# Patient Record
Sex: Female | Born: 1980
Health system: Southern US, Community
[De-identification: ages and names within clinical notes are randomized; demographics above are authoritative.]

## PROBLEM LIST (undated history)

## (undated) DIAGNOSIS — K9041 Non-celiac gluten sensitivity: Secondary | ICD-10-CM

## (undated) DIAGNOSIS — R519 Headache, unspecified: Secondary | ICD-10-CM

## (undated) DIAGNOSIS — R42 Dizziness and giddiness: Secondary | ICD-10-CM

## (undated) DIAGNOSIS — F419 Anxiety disorder, unspecified: Secondary | ICD-10-CM

## (undated) DIAGNOSIS — E079 Disorder of thyroid, unspecified: Secondary | ICD-10-CM

## (undated) DIAGNOSIS — E039 Hypothyroidism, unspecified: Secondary | ICD-10-CM

## (undated) DIAGNOSIS — F32A Depression, unspecified: Secondary | ICD-10-CM

## (undated) DIAGNOSIS — F329 Major depressive disorder, single episode, unspecified: Secondary | ICD-10-CM

## (undated) DIAGNOSIS — R51 Headache: Secondary | ICD-10-CM

## (undated) HISTORY — DX: Major depressive disorder, single episode, unspecified: F32.9

## (undated) HISTORY — PX: CHOLECYSTECTOMY: SHX55

## (undated) HISTORY — DX: Anxiety disorder, unspecified: F41.9

## (undated) HISTORY — DX: Depression, unspecified: F32.A

---

## 2017-10-04 DIAGNOSIS — M25561 Pain in right knee: Secondary | ICD-10-CM | POA: Diagnosis not present

## 2017-10-04 DIAGNOSIS — F419 Anxiety disorder, unspecified: Secondary | ICD-10-CM | POA: Diagnosis not present

## 2017-10-04 DIAGNOSIS — J4520 Mild intermittent asthma, uncomplicated: Secondary | ICD-10-CM | POA: Diagnosis not present

## 2017-10-04 DIAGNOSIS — E039 Hypothyroidism, unspecified: Secondary | ICD-10-CM | POA: Diagnosis not present

## 2017-10-04 DIAGNOSIS — M25562 Pain in left knee: Secondary | ICD-10-CM | POA: Diagnosis not present

## 2017-11-14 DIAGNOSIS — Z01411 Encounter for gynecological examination (general) (routine) with abnormal findings: Secondary | ICD-10-CM | POA: Diagnosis not present

## 2017-11-14 DIAGNOSIS — N941 Unspecified dyspareunia: Secondary | ICD-10-CM | POA: Diagnosis not present

## 2017-11-22 DIAGNOSIS — R002 Palpitations: Secondary | ICD-10-CM | POA: Diagnosis not present

## 2017-11-22 DIAGNOSIS — E039 Hypothyroidism, unspecified: Secondary | ICD-10-CM | POA: Diagnosis not present

## 2017-11-22 DIAGNOSIS — N920 Excessive and frequent menstruation with regular cycle: Secondary | ICD-10-CM | POA: Diagnosis not present

## 2017-11-22 DIAGNOSIS — F419 Anxiety disorder, unspecified: Secondary | ICD-10-CM | POA: Diagnosis not present

## 2018-01-07 DIAGNOSIS — J020 Streptococcal pharyngitis: Secondary | ICD-10-CM | POA: Diagnosis not present

## 2018-01-07 DIAGNOSIS — J029 Acute pharyngitis, unspecified: Secondary | ICD-10-CM | POA: Diagnosis not present

## 2018-01-28 ENCOUNTER — Ambulatory Visit: Payer: BLUE CROSS/BLUE SHIELD | Admitting: Internal Medicine

## 2018-02-18 DIAGNOSIS — H66001 Acute suppurative otitis media without spontaneous rupture of ear drum, right ear: Secondary | ICD-10-CM | POA: Diagnosis not present

## 2018-03-25 DIAGNOSIS — K529 Noninfective gastroenteritis and colitis, unspecified: Secondary | ICD-10-CM | POA: Diagnosis not present

## 2018-03-25 DIAGNOSIS — F419 Anxiety disorder, unspecified: Secondary | ICD-10-CM | POA: Diagnosis not present

## 2018-03-25 DIAGNOSIS — E039 Hypothyroidism, unspecified: Secondary | ICD-10-CM | POA: Diagnosis not present

## 2018-05-20 DIAGNOSIS — R110 Nausea: Secondary | ICD-10-CM | POA: Diagnosis not present

## 2018-05-20 DIAGNOSIS — T43615A Adverse effect of caffeine, initial encounter: Secondary | ICD-10-CM | POA: Diagnosis not present

## 2018-05-20 DIAGNOSIS — R12 Heartburn: Secondary | ICD-10-CM | POA: Diagnosis not present

## 2018-05-20 DIAGNOSIS — R42 Dizziness and giddiness: Secondary | ICD-10-CM | POA: Diagnosis not present

## 2018-05-27 DIAGNOSIS — F419 Anxiety disorder, unspecified: Secondary | ICD-10-CM | POA: Diagnosis not present

## 2018-05-27 DIAGNOSIS — Z8659 Personal history of other mental and behavioral disorders: Secondary | ICD-10-CM | POA: Diagnosis not present

## 2018-05-27 DIAGNOSIS — R0789 Other chest pain: Secondary | ICD-10-CM | POA: Diagnosis not present

## 2018-05-27 DIAGNOSIS — E039 Hypothyroidism, unspecified: Secondary | ICD-10-CM | POA: Diagnosis not present

## 2018-05-27 DIAGNOSIS — E559 Vitamin D deficiency, unspecified: Secondary | ICD-10-CM | POA: Diagnosis not present

## 2018-05-28 DIAGNOSIS — Z0000 Encounter for general adult medical examination without abnormal findings: Secondary | ICD-10-CM | POA: Diagnosis not present

## 2018-06-06 ENCOUNTER — Encounter: Payer: Self-pay | Admitting: Internal Medicine

## 2018-06-06 ENCOUNTER — Ambulatory Visit: Payer: BLUE CROSS/BLUE SHIELD | Admitting: Internal Medicine

## 2018-06-06 VITALS — BP 98/72 | HR 79 | Ht 64.0 in | Wt 151.0 lb

## 2018-06-06 DIAGNOSIS — E038 Other specified hypothyroidism: Secondary | ICD-10-CM | POA: Diagnosis not present

## 2018-06-06 DIAGNOSIS — E042 Nontoxic multinodular goiter: Secondary | ICD-10-CM

## 2018-06-06 DIAGNOSIS — E063 Autoimmune thyroiditis: Secondary | ICD-10-CM | POA: Diagnosis not present

## 2018-06-06 MED ORDER — ARMOUR THYROID 60 MG PO TABS: 30.0000 mg | ORAL_TABLET | Freq: Every day | ORAL | 5 refills | Status: DC

## 2018-06-06 MED ORDER — ARMOUR THYROID 90 MG PO TABS: 45.0000 mg | ORAL_TABLET | Freq: Every day | ORAL | 5 refills | Status: DC

## 2018-06-06 NOTE — Patient Instructions (Addendum)
Please change to Armour 75 daily.  You can alternate 60 and 90 mg every other day.  Take the thyroid hormone every day, with water, at least 30 minutes before breakfast, separated by at least 4 hours from: - acid reflux medications - calcium - iron - multivitamins  Please come back in 1.5 months for labs.  Please come back for a follow-up appointment in 6 months.

## 2018-06-06 NOTE — Progress Notes (Signed)
Patient ID: Kara Patton, female   DOB: 07/02/1981, 37 y.o.   MRN: [id number]    HPI  Kara Patton is a 37 y.o.-year-old female, referred by her PCP, Dr. Constance GoltzSchoenhoff, for management of Hashimoto's hypothyroidism. Moved here from [place] 2018.  Pt. has been dx with hypothyroidism in mid 2000s  >> on Levothyroxine 125 mcg (last dose change 03/2018), prev. 137 mcg for years.  She takes the thyroid hormone: - fasting - with water - occas. Coffee (decaf) - half and half - separated by 1h from b'fast  - no calcium, iron, PPIs  - no multivitamins  + Mg, B12, Sertraline, vit D, Osteo-biflex  I reviewed pt's thyroid tests: 05/27/2018: TSH 0.99, free T4 1.27 (0.61-1.12), free T3 3.22 (2.5-3.9) No results found for: TSH, FREET4, T3FREE  Antithyroid antibodies: No results found for: THGAB No components found for: TPOAB  Pt denies: - weight gain  But has: - + fatigue - + cold intolerance - no depression - + diarrhea/+ constipation - + hair loss - + jt pain  She has anxiety >> more pronounced recently.  Pt denies feeling nodules in neck, hoarseness, odynophagia, SOB with lying down. She recently has dysphagia when she eats >> lost 7 lbs over last 3 weeks.  She had a thyroid U/S while she was living in [place]: positive for thyroiditis and few small nodules.  She has + FH of thyroid disorders in: nephew. No FH of thyroid cancer.  No h/o radiation tx to head or neck. No recent use of iodine supplements.  Pt. also has a history of vit D insuff.  ROS: Constitutional: + See HPI Eyes: No blurry vision, no xerophthalmia ENT: no sore throat, + see HPI, + tinnitus Cardiovascular: no CP/+ SOB/+ palpitations/no leg swelling Respiratory: no cough/+ SOB Gastrointestinal: + N/+ V/+ D/+ C, + heartburn Musculoskeletal: + muscle aches/+ joint aches Skin: + rashes, + itching, + easy bruising, + hair loss Neurological: no tremors/numbness/tingling/dizziness, + HA Psychiatric: No depression/+  anxiety  No past medical history on file.  Social History   Socioeconomic History  . Marital status: Married    Spouse name: Not on file  . Number of children: 2  . Years of education: Not on file  . Highest education level: Not on file  Occupational History  .  Stay-at-home mom  Social Needs  . Financial resource strain: Not on file  . Food insecurity:    Worry: Not on file    Inability: Not on file  . Transportation needs:    Medical: Not on file    Non-medical: Not on file  Tobacco Use  . Smoking status: Never Smoker  . Smokeless tobacco: Never Used  Substance and Sexual Activity  . Alcohol use:  Wine, 1-2 times a month, 1-2 drinks at the time    Frequency: Never  . Drug use: Never   Current Outpatient Medications on File Prior to Visit  Medication Sig Dispense Refill  . cholecalciferol (VITAMIN D) 1000 units tablet Take 1,000 Units by mouth daily.    . Glucosamine-Chondroitin (OSTEO BI-FLEX REGULAR STRENGTH PO) Take by mouth.    Marland Kitchen. LORazepam (ATIVAN) 0.5 MG tablet TAKE 1 TABLET BY MOUTH EVERY 12 HOURS AS NEEDED ANXIETY  1  . magnesium 30 MG tablet Take 30 mg by mouth 2 (two) times daily.    . Omega-3 Fatty Acids (FISH OIL) 1000 MG CAPS Take by mouth.    . sertraline (ZOLOFT) 25 MG tablet 50 mg.   1  No current facility-administered medications on file prior to visit.   Levothyroxine 125 mcg daily  Allergies  Allergen Reactions  . Advil [Ibuprofen] Other (See Comments)    Kidney dysfunction  . Penicillins Hives    And swelling    Family history:  Diabetes in mother, paternal uncles, maternal grandmother  Hypertension in mother, father, grandparents  Hyperlipidemia in mother, father, grandparents  Heart disease in father, grandparents, paternal uncle  Cancer in grandmother, cousin, father, uncle, great-grandmother  PE: BP 98/72 (BP Location: Right Arm, Patient Position: Sitting, Cuff Size: Normal)   Pulse 79   Ht 5\' 4"  (1.626 m)   Wt 151 lb (68.5 kg)    SpO2 98%   BMI 25.92 kg/m  Wt Readings from Last 3 Encounters:  06/06/18 151 lb (68.5 kg)   Constitutional: overweight, in NAD Eyes: PERRLA, EOMI, no exophthalmos ENT: moist mucous membranes, no thyromegaly, but left side of the thyroid more prominent, no cervical lymphadenopathy Cardiovascular: RRR, No MRG Respiratory: CTA B Gastrointestinal: abdomen soft, NT, ND, BS+ Musculoskeletal: no deformities, strength intact in all 4 Skin: moist, warm, no rashes Neurological: no tremor with outstretched hands, DTR normal in all 4  ASSESSMENT: 1. Hypothyroidism  2.  History of thyroid nodules  PLAN:  1. Patient with long-standing hypothyroidism, on levothyroxine therapy. Recent labs reviewed from earlier this month >> normal TSH was slightly high free T4, which I explained is usually related to the timing of her levothyroxine dose and not influencing the prompting a change in levothyroxine dose. - she appears euthyroid, but has multiple complaints, including generalized fatigue despite sleeping 8 to 9 hours a night, and also joint pain, cold intolerance, constipation, alternating with episodes of diarrhea - We discussed about the possibility of using a combination of free T4 and free T3 instead of only free T3 to see if this improves her fatigue,  cold intolerance, and other hypothyroid symptoms.  She agrees with this and I suggested Armour, 60 alternating with 90 mg every other day, the equivalent of her current levothyroxine dose of 125 mcg daily - We discussed about correct intake of levothyroxine, fasting, with water, separated by at least 30 minutes from breakfast, and separated by more than 4 hours from calcium, iron, multivitamins, acid reflux medications (PPIs).  She is taking this correctly. - will check thyroid tests in 1.5 months - Otherwise, I will see her back in 6 months  2.  History of thyroid nodules -She has a history of small thyroid nodules seen on ultrasound many years ago  in [place] -She now describes dysphagia with every meal and she has lost a significant amount of weight because of this in the last 3 weeks.  While her symptoms could be related to anxiety and also to previous history of hiatal hernia, I would want to make sure that her thyroid nodules have not grown.  I do not feel a significant thyromegaly, but her left side of the thyroid is more prominent -We will order a thyroid ultrasound now  Narrative    CLINICAL DATA: Hypothyroidism  EXAM: THYROID ULTRASOUND  TECHNIQUE: Ultrasound examination of the thyroid gland and adjacent soft tissues was performed.  COMPARISON: None.  FINDINGS: Parenchymal Echotexture: Moderately heterogenous  Isthmus: 0.3 cm thickness  Right lobe: 5.6 x 1.2 x 1.7 cm  Left lobe: 5.1 x 1.1 x 1.2 cm  _________________________________________________________  Estimated total number of nodules >/= 1 cm: 0  Number of spongiform nodules >/= 2 cm not described below (TR1):  0  Number of mixed cystic and solid nodules >/= 1.5 cm not described below (TR2): 0  _________________________________________________________  No discrete nodules are seen within the thyroid gland.  IMPRESSION: 1. Mild thyromegaly with heterogenous parenchyma. No discrete nodule.  The above is in keeping with the ACR TI-RADS recommendations - J Am Coll Radiol 2017;14:587-595.   Electronically Signed By: Corlis Leak M.D. On: 06/21/2018 14:23     Carlus Pavlov, MD PhD [facility] Endocr[facility]

## 2018-06-07 DIAGNOSIS — R42 Dizziness and giddiness: Secondary | ICD-10-CM | POA: Diagnosis not present

## 2018-06-07 DIAGNOSIS — R109 Unspecified abdominal pain: Secondary | ICD-10-CM | POA: Diagnosis not present

## 2018-06-07 DIAGNOSIS — R110 Nausea: Secondary | ICD-10-CM | POA: Diagnosis not present

## 2018-06-10 ENCOUNTER — Emergency Department (HOSPITAL_COMMUNITY): Payer: BLUE CROSS/BLUE SHIELD

## 2018-06-10 ENCOUNTER — Other Ambulatory Visit: Payer: Self-pay

## 2018-06-10 ENCOUNTER — Encounter (HOSPITAL_COMMUNITY): Payer: Self-pay | Admitting: Emergency Medicine

## 2018-06-10 ENCOUNTER — Emergency Department (HOSPITAL_COMMUNITY)
Admission: EM | Admit: 2018-06-10 | Discharge: 2018-06-10 | Disposition: A | Discharge status: Home / Self Care | Payer: BLUE CROSS/BLUE SHIELD | Attending: Emergency Medicine | Admitting: Emergency Medicine

## 2018-06-10 DIAGNOSIS — E869 Volume depletion, unspecified: Secondary | ICD-10-CM | POA: Diagnosis not present

## 2018-06-10 DIAGNOSIS — R197 Diarrhea, unspecified: Secondary | ICD-10-CM | POA: Insufficient documentation

## 2018-06-10 DIAGNOSIS — E038 Other specified hypothyroidism: Secondary | ICD-10-CM | POA: Diagnosis not present

## 2018-06-10 DIAGNOSIS — R1031 Right lower quadrant pain: Secondary | ICD-10-CM | POA: Diagnosis not present

## 2018-06-10 DIAGNOSIS — Z79899 Other long term (current) drug therapy: Secondary | ICD-10-CM | POA: Insufficient documentation

## 2018-06-10 DIAGNOSIS — R1011 Right upper quadrant pain: Secondary | ICD-10-CM | POA: Diagnosis not present

## 2018-06-10 DIAGNOSIS — R42 Dizziness and giddiness: Secondary | ICD-10-CM | POA: Insufficient documentation

## 2018-06-10 DIAGNOSIS — R112 Nausea with vomiting, unspecified: Secondary | ICD-10-CM

## 2018-06-10 DIAGNOSIS — K529 Noninfective gastroenteritis and colitis, unspecified: Secondary | ICD-10-CM | POA: Diagnosis not present

## 2018-06-10 DIAGNOSIS — R1013 Epigastric pain: Secondary | ICD-10-CM | POA: Diagnosis not present

## 2018-06-10 DIAGNOSIS — R109 Unspecified abdominal pain: Secondary | ICD-10-CM | POA: Diagnosis not present

## 2018-06-10 HISTORY — DX: Non-celiac gluten sensitivity: K90.41

## 2018-06-10 HISTORY — DX: Disorder of thyroid, unspecified: E07.9

## 2018-06-10 LAB — COMPREHENSIVE METABOLIC PANEL
ALBUMIN: 4.6 g/dL (ref 3.5–5.0)
ALK PHOS: 39 U/L (ref 38–126)
ALT: 14 U/L (ref 0–44)
AST: 15 U/L (ref 15–41)
Anion gap: 11 (ref 5–15)
BUN: 9 mg/dL (ref 6–20)
CHLORIDE: 104 mmol/L (ref 98–111)
CO2: 27 mmol/L (ref 22–32)
Calcium: 9.7 mg/dL (ref 8.9–10.3)
Creatinine, Ser: 0.75 mg/dL (ref 0.44–1.00)
GFR calc non Af Amer: >60 mL/min (ref >60)
Glucose, Bld: 89 mg/dL (ref 70–99)
POTASSIUM: 4.2 mmol/L (ref 3.5–5.1)
SODIUM: 142 mmol/L (ref 135–145)
TOTAL PROTEIN: 7.9 g/dL (ref 6.5–8.1)
Total Bilirubin: 1.1 mg/dL (ref 0.3–1.2)

## 2018-06-10 LAB — CBC
HEMATOCRIT: 48.3 % — AB (ref 36.0–46.0)
HEMOGLOBIN: 15.7 g/dL — AB (ref 12.0–15.0)
MCH: 30.1 pg (ref 26.0–34.0)
MCHC: 32.5 g/dL (ref 30.0–36.0)
MCV: 92.5 fL (ref 78.0–100.0)
Platelets: 217 10*3/uL (ref 150–400)
RBC: 5.22 MIL/uL — AB (ref 3.87–5.11)
RDW: 11.4 % — ABNORMAL LOW (ref 11.5–15.5)
WBC: 6 10*3/uL (ref 4.0–10.5)

## 2018-06-10 LAB — URINALYSIS, ROUTINE W REFLEX MICROSCOPIC
BACTERIA UA: NONE SEEN
BILIRUBIN URINE: NEGATIVE
Glucose, UA: NEGATIVE mg/dL
KETONES UR: 20 mg/dL — AB
LEUKOCYTES UA: NEGATIVE
NITRITE: NEGATIVE
Protein, ur: NEGATIVE mg/dL
Specific Gravity, Urine: 1.013 (ref 1.005–1.030)
pH: 6 (ref 5.0–8.0)

## 2018-06-10 LAB — WET PREP, GENITAL
Clue Cells Wet Prep HPF POC: NONE SEEN
Sperm: NONE SEEN
Trich, Wet Prep: NONE SEEN
Yeast Wet Prep HPF POC: NONE SEEN

## 2018-06-10 LAB — I-STAT BETA HCG BLOOD, ED (MC, WL, AP ONLY)

## 2018-06-10 LAB — LIPASE, BLOOD: Lipase: 27 U/L (ref 11–51)

## 2018-06-10 MED ORDER — FAMOTIDINE IN NACL 20-0.9 MG/50ML-% IV SOLN
20.0000 mg | Freq: Once | INTRAVENOUS | Status: AC
  Administered 2018-06-10: 20 mg via INTRAVENOUS
  Filled 2018-06-10: qty 50

## 2018-06-10 MED ORDER — SODIUM CHLORIDE 0.9 % IV BOLUS
1000.0000 mL | Freq: Once | INTRAVENOUS | Status: AC
  Administered 2018-06-10: 1000 mL via INTRAVENOUS

## 2018-06-10 MED ORDER — IOPAMIDOL (ISOVUE-300) INJECTION 61%
100.0000 mL | Freq: Once | INTRAVENOUS | Status: AC | PRN
  Administered 2018-06-10: 100 mL via INTRAVENOUS

## 2018-06-10 MED ORDER — ONDANSETRON HCL 4 MG/2ML IJ SOLN
4.0000 mg | Freq: Once | INTRAMUSCULAR | Status: AC
  Administered 2018-06-10: 4 mg via INTRAVENOUS
  Filled 2018-06-10: qty 2

## 2018-06-10 MED ORDER — GI COCKTAIL ~~LOC~~
30.0000 mL | Freq: Once | ORAL | Status: DC
  Filled 2018-06-10: qty 30

## 2018-06-10 MED ORDER — IOPAMIDOL (ISOVUE-300) INJECTION 61%
INTRAVENOUS | Status: AC
  Filled 2018-06-10: qty 100

## 2018-06-10 NOTE — ED Notes (Signed)
Pt given water and applesauce  

## 2018-06-10 NOTE — Discharge Instructions (Signed)
1. Medications: Take Zofran as needed for nausea.  Wait around 20 minutes before eating or drinking after taking this medication.Take nexium as prescribed.  2. Treatment: rest, drink plenty of fluids, advance diet slowly.  Start with water and broth then advance to bland foods that will not upset your stomach such as crackers, mashed potatoes, and peanut butter.  You may also find it helpful to start taking a probiotic. 3. Follow Up: Please followup with your primary doctor in 3 days for discussion of your diagnoses and further evaluation after today's visit; follow-up with a gastroenterologist.  Please return to the ER for persistent vomiting, high fevers or worsening symptoms

## 2018-06-10 NOTE — ED Provider Notes (Signed)
Patient placed in Quick Look pathway, seen and evaluated   Chief Comlaint: Abdominal pain, nausea, dizziness  HPI:   37 year old female who presents for evaluation of 4 days of progressive worsening dizziness, nausea, right lower quadrant pain.  She was seen by Dr. 3 days ago for evaluation of symptoms.  He was given Zofran.  Patient reports she comes in today because she is continued having symptoms and abdominal pain has worsened.  She reports she has had some episodes of vomiting.  No blood noted in the vomit.  She reports decreased appetite and GERD symptoms.  She states she was having diarrhea.  No blood in the stool.  Diarrhea has improved significantly.  Patient reports that her dental pain is in the right side of her abdomen.  Denies any chest pain, difficulty breathing, urinary complaints.  ROS: Abdominal pain, nausea, dizziness  Physical Exam:   Gen: No distress  Neuro: Awake and Alert  Skin: Warm    Focused Exam: Abdomen soft, nondistended.  Tenderness to palpation in the right upper quadrant and right lower quadrant. CN II-XII intact. 5/5 strength of BUE and BLE.   Initiation of care has begun. The patient has been counseled on the process, plan, and necessity for staying for the completion/evaluation, and the remainder of the medical screening examination    Maxwell CaulLayden, Lindsey A, PA-C 06/10/18 1602    Rolan BuccoBelfi, Melanie, MD 06/10/18 2239

## 2018-06-10 NOTE — ED Notes (Signed)
Patient transported to CT 

## 2018-06-10 NOTE — ED Notes (Signed)
ED Provider at bedside. 

## 2018-06-10 NOTE — ED Provider Notes (Signed)
[facility] Provider Note   CSN: [id number] Arrival date & time: 06/10/18  1510     History   Chief Complaint Chief Complaint  Patient presents with  . Abdominal Pain    HPI Kara Patton is a 37 y.o. female with history of gluten intolerance and hypothyroidism presents for evaluation of acute onset, progressively worsening right-sided abdominal pain for 4 days.  Associated symptoms include lightheadedness with position changes, nausea, and 2 episodes of nonbloody nonbilious emesis.  She also has had 3-4 episodes of watery nonbloody loose stools daily for the past 4 days.  Pain in the abdomen is constant, burning radiating from just under the rib cage down to the right lower quadrant.  Pain worsens after eating.  She denies dysuria, hematuria, melena, hematochezia, urinary urgency, or frequency.  She does note decreased urine output but has had decreased oral intake.  She was seen and evaluated by her PCP 3 days ago which was overall unremarkable.  She was also tested for H. pylori which was negative.  She was seen and evaluated by another PCP in the heart practice earlier today who recommended presentation to the ED for further evaluation.  She states that they obtained orthostatic vital signs and that her blood pressure was low.  She states that the lightheadedness occurs primarily with position changes and persists with standing and that she is only comfortable when she is laying.  Denies vaginal itching, bleeding, or discharge out of the ordinary but is currently on her menstrual cycle.  The history is provided by the patient.    Past Medical History:  Diagnosis Date  . Gluten intolerance   . Thyroid disease    hypothyroid    Patient Active Problem List   Diagnosis Date Noted  . Hypothyroidism due to Hashimoto's thyroiditis 06/06/2018  . Multiple thyroid nodules 06/06/2018   OB History   None      Home Medications    Prior to Admission  medications   Medication Sig Start Date End Date Taking? Authorizing Provider  ARMOUR THYROID 60 MG tablet Take 0.5 tablets (30 mg total) by mouth daily before breakfast. 06/06/18   Carlus PavlovGherghe, Cristina, MD  ARMOUR THYROID 90 MG tablet Take 0.5 tablets (45 mg total) by mouth daily. 06/06/18   Carlus PavlovGherghe, Cristina, MD  cholecalciferol (VITAMIN D) 1000 units tablet Take 1,000 Units by mouth daily.    [provider]  Glucosamine-Chondroitin (OSTEO BI-FLEX REGULAR STRENGTH PO) Take by mouth.    [provider]  LORazepam (ATIVAN) 0.5 MG tablet TAKE 1 TABLET BY MOUTH EVERY 12 HOURS AS NEEDED ANXIETY 05/27/18   [provider]  magnesium 30 MG tablet Take 30 mg by mouth 2 (two) times daily.    [provider]  Omega-3 Fatty Acids (FISH OIL) 1000 MG CAPS Take by mouth.    [provider]  sertraline (ZOLOFT) 25 MG tablet 50 mg.  05/27/18   [provider]    Family History No family history on file.  Social History Social History   Tobacco Use  . Smoking status: Never Smoker  . Smokeless tobacco: Never Used  Substance Use Topics  . Alcohol use: Never    Frequency: Never  . Drug use: Never     Allergies   Advil [ibuprofen] and Penicillins   Review of Systems Review of Systems  Constitutional: Negative for chills and fever.  Eyes: Negative for photophobia and visual disturbance.  Respiratory: Negative for shortness of breath.  Cardiovascular: Negative for chest pain.  Gastrointestinal: Positive for abdominal pain, diarrhea, nausea and vomiting.  Genitourinary: Negative for dysuria, frequency, hematuria, urgency, vaginal discharge and vaginal pain.  Neurological: Positive for light-headedness. Negative for syncope, weakness and numbness.  All other systems reviewed and are negative.    Physical Exam Updated Vital Signs BP 105/83   Pulse (!) 57   Temp 98.4 F (36.9 C) (Oral)   Resp 18   LMP 06/07/2018   SpO2 99%   Physical Exam   Constitutional: She appears well-developed and well-nourished. No distress.  HENT:  Head: Normocephalic and atraumatic.  Eyes: Conjunctivae are normal. Right eye exhibits no discharge. Left eye exhibits no discharge.  Neck: No JVD present. No tracheal deviation present.  Cardiovascular: Normal rate, regular rhythm and normal heart sounds.  Pulmonary/Chest: Effort normal and breath sounds normal.  Abdominal: Soft. She exhibits no distension. Bowel sounds are decreased. There is tenderness in the right upper quadrant, right lower quadrant, epigastric area, periumbilical area and suprapubic area. There is guarding, CVA tenderness and tenderness at McBurney's point. There is no rigidity, no rebound and negative Murphy's sign.  Right CVA tenderness present  Genitourinary: Uterus is not deviated, not enlarged and not tender. Cervix exhibits no motion tenderness and no friability. Right adnexum displays no mass, no tenderness and no fullness. Left adnexum displays no mass, no tenderness and no fullness. There is bleeding in the vagina.  Genitourinary Comments: Examination performed in the presence of chaperone.  No masses or lesions to the external genitalia.  Blood in the vaginal vault noted but patient is currently menstruating  Musculoskeletal: She exhibits no edema.  Neurological: She is alert.  Skin: Skin is warm and dry. No erythema.  Psychiatric: She has a normal mood and affect. Her behavior is normal.  Nursing note and vitals reviewed.    ED Treatments / Results  Labs (all labs ordered are listed, but only abnormal results are displayed) Labs Reviewed  WET PREP, GENITAL - Abnormal; Notable for the following components:      Result Value   WBC, Wet Prep HPF POC MANY (*)    All other components within normal limits  CBC - Abnormal; Notable for the following components:   RBC 5.22 (*)    Hemoglobin 15.7 (*)    HCT 48.3 (*)    RDW 11.4 (*)    All other components within normal limits    URINALYSIS, ROUTINE W REFLEX MICROSCOPIC - Abnormal; Notable for the following components:   Hgb urine dipstick LARGE (*)    Ketones, ur 20 (*)    RBC / HPF >50 (*)    All other components within normal limits  LIPASE, BLOOD  COMPREHENSIVE METABOLIC PANEL  I-STAT BETA HCG BLOOD, ED ([facility], [facility], [facility] ONLY)  GC/CHLAMYDIA PROBE AMP ([place]) NOT AT [facility]    EKG None  Radiology Ct Abdomen Pelvis W Contrast  Result Date: 06/10/2018 CLINICAL DATA:  Right lower quadrant pain, nausea EXAM: CT ABDOMEN AND PELVIS WITH CONTRAST TECHNIQUE: Multidetector CT imaging of the abdomen and pelvis was performed using the standard protocol following bolus administration of intravenous contrast. CONTRAST:  ISOVUE-300 IOPAMIDOL (ISOVUE-300) INJECTION 61% COMPARISON:  None. FINDINGS: Lower chest: Lung bases are clear. No effusions. Heart is normal size. Hepatobiliary: No focal liver abnormality is seen. Status post cholecystectomy. No biliary dilatation. Pancreas: No focal abnormality or ductal dilatation. Spleen: No focal abnormality.  Normal size. Adrenals/Urinary Tract: No adrenal abnormality. No focal renal abnormality. No stones or  hydronephrosis. Urinary bladder is unremarkable. Stomach/Bowel: Normal appendix. Stomach, large and small bowel grossly unremarkable. Vascular/Lymphatic: No evidence of aneurysm or adenopathy. Reproductive: Uterus and adnexa unremarkable.  No mass. Other: No free fluid or free air. Musculoskeletal: No acute bony abnormality or focal bone lesion. IMPRESSION: Prior cholecystectomy. Normal appendix. No acute findings in the abdomen or pelvis. Electronically Signed   By: Charlett Nose M.D.   On: 06/10/2018 19:47    Procedures Procedures (including critical care time)  Medications Ordered in ED Medications  gi cocktail (Maalox,Lidocaine,Donnatal) (0 mLs Oral Hold 06/10/18 1819)  iopamidol (ISOVUE-300) 61 % injection (has no administration in time range)  sodium chloride 0.9 % bolus  1,000 mL (0 mLs Intravenous Stopped 06/10/18 2021)  famotidine (PEPCID) IVPB 20 mg premix (0 mg Intravenous Stopped 06/10/18 1911)  ondansetron (ZOFRAN) injection 4 mg (4 mg Intravenous Given 06/10/18 1942)  iopamidol (ISOVUE-300) 61 % injection 100 mL (100 mLs Intravenous Contrast Given 06/10/18 1920)  sodium chloride 0.9 % bolus 1,000 mL (0 mLs Intravenous Stopped 06/10/18 2121)     Initial Impression / Assessment and Plan / ED Course  I have reviewed the triage vital signs and the nursing notes.  Pertinent labs & imaging results that were available during my care of the patient were reviewed by me and considered in my medical decision making (see chart for details).    Patient with complaint of 4-day history of right-sided abdominal pain, nausea, vomiting, diarrhea, and positional lightheadedness.  She is afebrile, vital signs are stable.  She is nontoxic in appearance.  She is not orthostatic but she does become lightheaded when standing.  No syncope.  No focal neurologic deficits.  No peritoneal signs on examination of the abdomen.  Lab work reviewed by me shows no leukocytosis, no anemia, no electrolyte abnormalities.  LFTs, lipase, and creatinine are within normal limits.  She does have blood in her urine although she is currently menstruating so I believe this is contaminated.  Otherwise no evidence of UTI or nephrolithiasis.  The patient does appear to be dehydrated with ketones in her urine.  No cervical motion tenderness or adnexal tenderness to suggest PID.  I doubt obstruction, perforation, but as it is, colitis, ectopic pregnancy, ovarian torsion, TOA, or other acute surgical abdominal pathology.  CT of the abdomen and pelvis shows no acute findings.  On reevaluation, the patient is resting comfortably no apparent distress.  She received IV fluids, IV Pepcid, and GI cocktail and states that she is feeling better.  She is tolerating p.o. food and fluids without difficulty, serial abdominal  examinations remain benign and she is ambulatory without difficulty.  Stable for discharge home with follow-up with her PCP.  We will also refer her to gastroenterology.  Discussed strict ED return precautions.  She has Zofran at home which has been helpful for her.  She is also going to start Nexium which her PCP is prescribing for her.  Instructions to advance diet slowly and add probiotics.  Patient and patient's mother verbalized understanding of and agreement with plan and patient is stable for discharge home at this time.  Final Clinical Impressions(s) / ED Diagnoses   Final diagnoses:  Right sided abdominal pain  Nausea vomiting and diarrhea  Intermittent lightheadedness    ED Discharge Orders    None       Bennye Alm 06/10/18 2203    Little, Ambrose Finland, MD 06/12/18 [phone]

## 2018-06-10 NOTE — ED Triage Notes (Signed)
Pt sent by PCP with c/o RLQ pain, nausea, dizziness x 4 days. Dizziness improves with lying flat.

## 2018-06-11 LAB — GC/CHLAMYDIA PROBE AMP (~~LOC~~) NOT AT ARMC
Chlamydia: NEGATIVE
Neisseria Gonorrhea: NEGATIVE

## 2018-06-12 DIAGNOSIS — R1310 Dysphagia, unspecified: Secondary | ICD-10-CM | POA: Diagnosis not present

## 2018-06-12 DIAGNOSIS — R1013 Epigastric pain: Secondary | ICD-10-CM | POA: Diagnosis not present

## 2018-06-13 DIAGNOSIS — R12 Heartburn: Secondary | ICD-10-CM | POA: Diagnosis not present

## 2018-06-13 DIAGNOSIS — K2940 Chronic atrophic gastritis without bleeding: Secondary | ICD-10-CM | POA: Diagnosis not present

## 2018-06-13 DIAGNOSIS — R1013 Epigastric pain: Secondary | ICD-10-CM | POA: Diagnosis not present

## 2018-06-13 DIAGNOSIS — R197 Diarrhea, unspecified: Secondary | ICD-10-CM | POA: Diagnosis not present

## 2018-06-13 DIAGNOSIS — R1314 Dysphagia, pharyngoesophageal phase: Secondary | ICD-10-CM | POA: Diagnosis not present

## 2018-06-13 DIAGNOSIS — R1310 Dysphagia, unspecified: Secondary | ICD-10-CM | POA: Diagnosis not present

## 2018-06-13 DIAGNOSIS — R195 Other fecal abnormalities: Secondary | ICD-10-CM | POA: Diagnosis not present

## 2018-06-16 DIAGNOSIS — R42 Dizziness and giddiness: Secondary | ICD-10-CM

## 2018-06-16 HISTORY — DX: Dizziness and giddiness: R42

## 2018-06-18 ENCOUNTER — Emergency Department (HOSPITAL_COMMUNITY): Payer: BLUE CROSS/BLUE SHIELD

## 2018-06-18 ENCOUNTER — Other Ambulatory Visit: Payer: Self-pay | Admitting: Geriatric Medicine

## 2018-06-18 ENCOUNTER — Encounter (HOSPITAL_COMMUNITY): Payer: Self-pay

## 2018-06-18 ENCOUNTER — Other Ambulatory Visit: Payer: Self-pay

## 2018-06-18 ENCOUNTER — Telehealth: Payer: Self-pay | Admitting: Internal Medicine

## 2018-06-18 ENCOUNTER — Observation Stay (HOSPITAL_COMMUNITY)
Admission: EM | Admit: 2018-06-18 | Discharge: 2018-06-20 | Disposition: A | Discharge status: Home / Self Care | Payer: BLUE CROSS/BLUE SHIELD | Attending: Internal Medicine | Admitting: Internal Medicine

## 2018-06-18 DIAGNOSIS — R55 Syncope and collapse: Secondary | ICD-10-CM | POA: Diagnosis not present

## 2018-06-18 DIAGNOSIS — R42 Dizziness and giddiness: Secondary | ICD-10-CM

## 2018-06-18 DIAGNOSIS — R531 Weakness: Secondary | ICD-10-CM | POA: Insufficient documentation

## 2018-06-18 DIAGNOSIS — G4452 New daily persistent headache (NDPH): Secondary | ICD-10-CM

## 2018-06-18 DIAGNOSIS — R0602 Shortness of breath: Secondary | ICD-10-CM | POA: Diagnosis not present

## 2018-06-18 DIAGNOSIS — Z79899 Other long term (current) drug therapy: Secondary | ICD-10-CM | POA: Insufficient documentation

## 2018-06-18 DIAGNOSIS — R110 Nausea: Secondary | ICD-10-CM | POA: Diagnosis not present

## 2018-06-18 DIAGNOSIS — F410 Panic disorder [episodic paroxysmal anxiety] without agoraphobia: Secondary | ICD-10-CM | POA: Diagnosis not present

## 2018-06-18 DIAGNOSIS — R109 Unspecified abdominal pain: Secondary | ICD-10-CM | POA: Diagnosis not present

## 2018-06-18 DIAGNOSIS — R0789 Other chest pain: Secondary | ICD-10-CM | POA: Diagnosis not present

## 2018-06-18 DIAGNOSIS — R9431 Abnormal electrocardiogram [ECG] [EKG]: Secondary | ICD-10-CM | POA: Diagnosis not present

## 2018-06-18 DIAGNOSIS — R112 Nausea with vomiting, unspecified: Secondary | ICD-10-CM | POA: Diagnosis not present

## 2018-06-18 DIAGNOSIS — R51 Headache: Secondary | ICD-10-CM | POA: Insufficient documentation

## 2018-06-18 DIAGNOSIS — K9041 Non-celiac gluten sensitivity: Secondary | ICD-10-CM | POA: Insufficient documentation

## 2018-06-18 DIAGNOSIS — R079 Chest pain, unspecified: Secondary | ICD-10-CM | POA: Diagnosis not present

## 2018-06-18 DIAGNOSIS — E063 Autoimmune thyroiditis: Secondary | ICD-10-CM | POA: Diagnosis not present

## 2018-06-18 DIAGNOSIS — Z79890 Hormone replacement therapy (postmenopausal): Secondary | ICD-10-CM | POA: Diagnosis not present

## 2018-06-18 DIAGNOSIS — E038 Other specified hypothyroidism: Secondary | ICD-10-CM | POA: Diagnosis present

## 2018-06-18 DIAGNOSIS — Z9049 Acquired absence of other specified parts of digestive tract: Secondary | ICD-10-CM | POA: Diagnosis not present

## 2018-06-18 HISTORY — DX: Headache, unspecified: R51.9

## 2018-06-18 HISTORY — DX: Headache: R51

## 2018-06-18 HISTORY — DX: Dizziness and giddiness: R42

## 2018-06-18 HISTORY — DX: Hypothyroidism, unspecified: E03.9

## 2018-06-18 LAB — CBC WITH DIFFERENTIAL/PLATELET
Abs Immature Granulocytes: 0 10*3/uL (ref 0.0–0.1)
Basophils Absolute: 0 10*3/uL (ref 0.0–0.1)
Basophils Relative: 0 %
EOS ABS: 0 10*3/uL (ref 0.0–0.7)
EOS PCT: 0 %
HEMATOCRIT: 43.5 % (ref 36.0–46.0)
Hemoglobin: 14.6 g/dL (ref 12.0–15.0)
IMMATURE GRANULOCYTES: 0 %
LYMPHS ABS: 2.1 10*3/uL (ref 0.7–4.0)
Lymphocytes Relative: 32 %
MCH: 30.4 pg (ref 26.0–34.0)
MCHC: 33.6 g/dL (ref 30.0–36.0)
MCV: 90.6 fL (ref 78.0–100.0)
MONO ABS: 0.5 10*3/uL (ref 0.1–1.0)
MONOS PCT: 8 %
NEUTROS PCT: 60 %
Neutro Abs: 3.9 10*3/uL (ref 1.7–7.7)
Platelets: 210 10*3/uL (ref 150–400)
RBC: 4.8 MIL/uL (ref 3.87–5.11)
RDW: 11.1 % — AB (ref 11.5–15.5)
WBC: 6.5 10*3/uL (ref 4.0–10.5)

## 2018-06-18 LAB — I-STAT TROPONIN, ED
TROPONIN I, POC: 0.02 ng/mL (ref 0.00–0.08)
Troponin i, poc: 0.02 ng/mL (ref 0.00–0.08)

## 2018-06-18 LAB — COMPREHENSIVE METABOLIC PANEL
ALT: 14 U/L (ref 0–44)
AST: 12 U/L — AB (ref 15–41)
Albumin: 4.2 g/dL (ref 3.5–5.0)
Alkaline Phosphatase: 35 U/L — ABNORMAL LOW (ref 38–126)
Anion gap: 9 (ref 5–15)
BILIRUBIN TOTAL: 0.9 mg/dL (ref 0.3–1.2)
BUN: 11 mg/dL (ref 6–20)
CALCIUM: 9.6 mg/dL (ref 8.9–10.3)
CHLORIDE: 105 mmol/L (ref 98–111)
CO2: 29 mmol/L (ref 22–32)
CREATININE: 0.69 mg/dL (ref 0.44–1.00)
Glucose, Bld: 100 mg/dL — ABNORMAL HIGH (ref 70–99)
Potassium: 4.1 mmol/L (ref 3.5–5.1)
Sodium: 143 mmol/L (ref 135–145)
Total Protein: 7.4 g/dL (ref 6.5–8.1)

## 2018-06-18 LAB — LIPASE, BLOOD: LIPASE: 33 U/L (ref 11–51)

## 2018-06-18 LAB — URINALYSIS, ROUTINE W REFLEX MICROSCOPIC
Bilirubin Urine: NEGATIVE
GLUCOSE, UA: NEGATIVE mg/dL
HGB URINE DIPSTICK: NEGATIVE
KETONES UR: 5 mg/dL — AB
Leukocytes, UA: NEGATIVE
Nitrite: NEGATIVE
PROTEIN: NEGATIVE mg/dL
SPECIFIC GRAVITY, URINE: 1.006 (ref 1.005–1.030)
pH: 6 (ref 5.0–8.0)

## 2018-06-18 LAB — I-STAT BETA HCG BLOOD, ED (MC, WL, AP ONLY): I-stat hCG, quantitative: <5 m[IU]/mL (ref <5)

## 2018-06-18 MED ORDER — ONDANSETRON HCL 4 MG/2ML IJ SOLN
4.0000 mg | Freq: Once | INTRAMUSCULAR | Status: AC
  Administered 2018-06-18: 4 mg via INTRAVENOUS
  Filled 2018-06-18: qty 2

## 2018-06-18 MED ORDER — IOPAMIDOL (ISOVUE-370) INJECTION 76%
INTRAVENOUS | Status: AC
  Filled 2018-06-18: qty 100

## 2018-06-18 MED ORDER — SODIUM CHLORIDE 0.9 % IV BOLUS
2000.0000 mL | Freq: Once | INTRAVENOUS | Status: AC
  Administered 2018-06-18: 2000 mL via INTRAVENOUS

## 2018-06-18 MED ORDER — ACETAMINOPHEN 325 MG PO TABS
650.0000 mg | ORAL_TABLET | Freq: Once | ORAL | Status: AC
  Administered 2018-06-18: 650 mg via ORAL
  Filled 2018-06-18: qty 2

## 2018-06-18 MED ORDER — IOPAMIDOL (ISOVUE-370) INJECTION 76%
100.0000 mL | Freq: Once | INTRAVENOUS | Status: AC | PRN
  Administered 2018-06-18: 100 mL via INTRAVENOUS

## 2018-06-18 NOTE — Telephone Encounter (Signed)
Pt came into office after leaving PCP and we sent to ER she is weak, head pain, and just feeling awful. Thyroid labs from last visit where all normal.

## 2018-06-18 NOTE — Telephone Encounter (Signed)
LMTCB

## 2018-06-18 NOTE — Telephone Encounter (Signed)
Sorry to hear that!  No, these should not be side effects from Armour. Anxiety may appear, but it would be uncommon. Just To be safe. I may suggest to go back to Levothyroxine 125 mcg daily until she feels better, Then retry Armour.

## 2018-06-18 NOTE — ED Provider Notes (Signed)
Patient placed in Quick Look pathway, seen and evaluated   Chief Complaint: multiple complaints  HPI:   Pt is a 37 y/o female with a h/o gluten intolerance, hypothyroidism, cholecystectomy who presents to the ED today for evaluation of multiple complaints. Pt states that for the last 12 days she has had abdominal pain, nausea, vomiting (resolved), diarrhea (resolved), chest tightness. Pt is also c/o pressure to the back of her head, dizziness, near syncope, and generalized weakness. Has had decreased PO intake and states she has lost 12 lbs since onset of her sxs.  Was seen in the ED and had negative workup recently. She was referred to GI and had an EGD 6 days ago. She was placed on carafate QID and pantoprazole with no improvement. Recently discontinued sertraline 5 days ago.   Has h/o hashimotos and is scheduled for thyroid US in 2 days.   ROS: abd pain, chest tightness (one)  Physical Exam:   Gen: No distress  Neuro: Awake and Alert  Skin: Warm    Focused Exam: CTAB, RRR, no murmur. TTP to RUQ and epigastrium. Hyperactive BS. No rigidity or guarding.   Initiation of care has begun. The patient has been counseled on the process, plan, and necessity for staying for the completion/evaluation, and the remainder of the medical screening examination  Pt advised to inform nursing staff immediately if they experience any new or worsening of symptoms while waiting in the waiting room.    Rayne Du 06/18/18 1533    Raeford Razor, MD 06/19/18 [phone]

## 2018-06-18 NOTE — ED Notes (Signed)
Pt taken to CT.

## 2018-06-18 NOTE — Telephone Encounter (Signed)
Patient's husband stated that the patient is very ill. They are not sure if this could be caused by her thyroid. They requested a call back from the nurse,.   Please advise

## 2018-06-18 NOTE — ED Triage Notes (Signed)
Pt seen here on the 26th of August for abdominal pain and followed up with GI specialists.  Stating still not able to keep much down and now having chest pain along with it.  Still feeling like she is going to pass out when walking, resolves when sitting.  A&Ox4.

## 2018-06-18 NOTE — Telephone Encounter (Signed)
Started to take sertraline before appointment with our office. Pt stated that she has been sick for 2 weeks, seen in hospital told it was for dehydration. Waiting on biopsy results from endoscopy. Swallowing issue has gotten worse. Pt has a burning from her lower abdomen to her throat. Diarrhea, nausea, and trouble walking distants. Her anxiety is very high, and they would like to know if this could be related to anything from an Endocrinology stand point?

## 2018-06-19 ENCOUNTER — Ambulatory Visit (HOSPITAL_BASED_OUTPATIENT_CLINIC_OR_DEPARTMENT_OTHER): Payer: BLUE CROSS/BLUE SHIELD

## 2018-06-19 ENCOUNTER — Encounter (HOSPITAL_COMMUNITY): Payer: Self-pay | Admitting: Physician Assistant

## 2018-06-19 ENCOUNTER — Other Ambulatory Visit (HOSPITAL_COMMUNITY): Payer: BLUE CROSS/BLUE SHIELD

## 2018-06-19 ENCOUNTER — Other Ambulatory Visit: Payer: Self-pay

## 2018-06-19 ENCOUNTER — Observation Stay (HOSPITAL_COMMUNITY): Payer: BLUE CROSS/BLUE SHIELD

## 2018-06-19 DIAGNOSIS — R9431 Abnormal electrocardiogram [ECG] [EKG]: Secondary | ICD-10-CM | POA: Diagnosis not present

## 2018-06-19 DIAGNOSIS — R109 Unspecified abdominal pain: Secondary | ICD-10-CM

## 2018-06-19 DIAGNOSIS — R42 Dizziness and giddiness: Secondary | ICD-10-CM

## 2018-06-19 DIAGNOSIS — R072 Precordial pain: Secondary | ICD-10-CM

## 2018-06-19 DIAGNOSIS — E038 Other specified hypothyroidism: Secondary | ICD-10-CM | POA: Diagnosis not present

## 2018-06-19 DIAGNOSIS — K2940 Chronic atrophic gastritis without bleeding: Secondary | ICD-10-CM | POA: Diagnosis not present

## 2018-06-19 DIAGNOSIS — F410 Panic disorder [episodic paroxysmal anxiety] without agoraphobia: Secondary | ICD-10-CM | POA: Diagnosis not present

## 2018-06-19 DIAGNOSIS — K9041 Non-celiac gluten sensitivity: Secondary | ICD-10-CM | POA: Diagnosis not present

## 2018-06-19 DIAGNOSIS — R0789 Other chest pain: Secondary | ICD-10-CM | POA: Diagnosis not present

## 2018-06-19 DIAGNOSIS — R112 Nausea with vomiting, unspecified: Secondary | ICD-10-CM | POA: Diagnosis present

## 2018-06-19 DIAGNOSIS — Z79890 Hormone replacement therapy (postmenopausal): Secondary | ICD-10-CM | POA: Diagnosis not present

## 2018-06-19 DIAGNOSIS — E063 Autoimmune thyroiditis: Secondary | ICD-10-CM

## 2018-06-19 DIAGNOSIS — R079 Chest pain, unspecified: Secondary | ICD-10-CM | POA: Diagnosis not present

## 2018-06-19 DIAGNOSIS — R531 Weakness: Secondary | ICD-10-CM | POA: Diagnosis not present

## 2018-06-19 DIAGNOSIS — Z79899 Other long term (current) drug therapy: Secondary | ICD-10-CM | POA: Diagnosis not present

## 2018-06-19 DIAGNOSIS — Z9049 Acquired absence of other specified parts of digestive tract: Secondary | ICD-10-CM | POA: Diagnosis not present

## 2018-06-19 DIAGNOSIS — R51 Headache: Secondary | ICD-10-CM | POA: Diagnosis not present

## 2018-06-19 DIAGNOSIS — R55 Syncope and collapse: Secondary | ICD-10-CM | POA: Diagnosis not present

## 2018-06-19 LAB — CBC
HEMATOCRIT: 40.5 % (ref 36.0–46.0)
HEMOGLOBIN: 13.4 g/dL (ref 12.0–15.0)
MCH: 30.2 pg (ref 26.0–34.0)
MCHC: 33.1 g/dL (ref 30.0–36.0)
MCV: 91.2 fL (ref 78.0–100.0)
Platelets: 183 10*3/uL (ref 150–400)
RBC: 4.44 MIL/uL (ref 3.87–5.11)
RDW: 11.1 % — ABNORMAL LOW (ref 11.5–15.5)
WBC: 4.7 10*3/uL (ref 4.0–10.5)

## 2018-06-19 LAB — LIPID PANEL
CHOLESTEROL: 121 mg/dL (ref 0–200)
HDL: 32 mg/dL — ABNORMAL LOW (ref >40)
LDL Cholesterol: 74 mg/dL (ref 0–99)
Total CHOL/HDL Ratio: 3.8 RATIO
Triglycerides: 77 mg/dL (ref <150)
VLDL: 15 mg/dL (ref 0–40)

## 2018-06-19 LAB — RAPID URINE DRUG SCREEN, HOSP PERFORMED
Amphetamines: NOT DETECTED
Barbiturates: NOT DETECTED
Benzodiazepines: NOT DETECTED
Cocaine: NOT DETECTED
Opiates: NOT DETECTED
Tetrahydrocannabinol: NOT DETECTED

## 2018-06-19 LAB — TROPONIN I

## 2018-06-19 LAB — HEMOGLOBIN A1C
HEMOGLOBIN A1C: 4.9 % (ref 4.8–5.6)
MEAN PLASMA GLUCOSE: 93.93 mg/dL

## 2018-06-19 LAB — CREATININE, SERUM: CREATININE: 0.69 mg/dL (ref 0.44–1.00)

## 2018-06-19 LAB — TSH: TSH: 1.54 u[IU]/mL (ref 0.350–4.500)

## 2018-06-19 LAB — ECHOCARDIOGRAM COMPLETE

## 2018-06-19 LAB — SEDIMENTATION RATE: SED RATE: 1 mm/h (ref 0–22)

## 2018-06-19 LAB — C-REACTIVE PROTEIN

## 2018-06-19 LAB — HCG, QUANTITATIVE, PREGNANCY

## 2018-06-19 MED ORDER — SUCRALFATE 1 G PO TABS
1.0000 g | ORAL_TABLET | Freq: Three times a day (TID) | ORAL | Status: DC
  Administered 2018-06-19 – 2018-06-20 (×6): 1 g via ORAL
  Filled 2018-06-19 (×7): qty 1

## 2018-06-19 MED ORDER — ACETAMINOPHEN 325 MG PO TABS
650.0000 mg | ORAL_TABLET | Freq: Four times a day (QID) | ORAL | Status: DC | PRN
  Administered 2018-06-19: 650 mg via ORAL
  Filled 2018-06-19: qty 2

## 2018-06-19 MED ORDER — MORPHINE SULFATE (PF) 2 MG/ML IV SOLN: 2.0000 mg | INTRAVENOUS | Status: DC | PRN

## 2018-06-19 MED ORDER — LEVOTHYROXINE SODIUM 25 MCG PO TABS
125.0000 ug | ORAL_TABLET | Freq: Every day | ORAL | Status: DC
  Administered 2018-06-20: 125 ug via ORAL
  Filled 2018-06-19 (×2): qty 1

## 2018-06-19 MED ORDER — COSYNTROPIN 0.25 MG IJ SOLR
0.2500 mg | Freq: Once | INTRAMUSCULAR | Status: AC
  Administered 2018-06-20: 0.25 mg via INTRAVENOUS
  Filled 2018-06-19: qty 0.25

## 2018-06-19 MED ORDER — SODIUM CHLORIDE 0.9 % IV SOLN
INTRAVENOUS | Status: DC
  Administered 2018-06-19: 07:00:00 via INTRAVENOUS

## 2018-06-19 MED ORDER — LORAZEPAM 0.5 MG PO TABS: 0.5000 mg | ORAL_TABLET | Freq: Two times a day (BID) | ORAL | Status: DC | PRN

## 2018-06-19 MED ORDER — GI COCKTAIL ~~LOC~~: 30.0000 mL | Freq: Three times a day (TID) | ORAL | Status: DC | PRN

## 2018-06-19 MED ORDER — LORAZEPAM 2 MG/ML IJ SOLN
1.0000 mg | INTRAMUSCULAR | Status: DC | PRN
  Administered 2018-06-19: 1 mg via INTRAVENOUS
  Filled 2018-06-19: qty 1

## 2018-06-19 MED ORDER — ONDANSETRON HCL 4 MG/2ML IJ SOLN: 4.0000 mg | Freq: Four times a day (QID) | INTRAMUSCULAR | Status: DC | PRN

## 2018-06-19 MED ORDER — NITROGLYCERIN 0.4 MG SL SUBL: 0.4000 mg | SUBLINGUAL_TABLET | SUBLINGUAL | Status: DC | PRN

## 2018-06-19 MED ORDER — PANTOPRAZOLE SODIUM 40 MG PO TBEC
40.0000 mg | DELAYED_RELEASE_TABLET | Freq: Two times a day (BID) | ORAL | Status: DC
  Administered 2018-06-19 – 2018-06-20 (×3): 40 mg via ORAL
  Filled 2018-06-19 (×3): qty 1

## 2018-06-19 MED ORDER — GI COCKTAIL ~~LOC~~: 30.0000 mL | Freq: Once | ORAL | Status: DC

## 2018-06-19 MED ORDER — ENOXAPARIN SODIUM 40 MG/0.4ML ~~LOC~~ SOLN
40.0000 mg | SUBCUTANEOUS | Status: DC
  Administered 2018-06-19: 40 mg via SUBCUTANEOUS
  Filled 2018-06-19: qty 0.4

## 2018-06-19 NOTE — H&P (Addendum)
History and Physical    Kara Patton OZH:[id number] DOB: 09/19/1981 DOA: 06/18/2018  Referring MD/NP/PA: EDP PCP:  Patient coming from: Home  Chief Complaint: nausea, chest pain, dizziness  HPI: Kara Patton is a 37 y.o. pleasant female with history of Hashimoto's thyroiditis, long history of hypothyroidism on Synthroid replacement, presented to the emergency room last night with multiple complaints, namely ongoing severe nausea x2 weeks,  diarrhea 1, dizziness, intermittent occipital headache and weakness. -Due to these symptoms which appear going on for about 2 weeks, she saw her endocrinologist who recommended consideration of thyroid Armour  Instead of levothyroxine however she has not started this yet, in addition due to GI symptoms with nausea, some heartburn and odynophagia she underwent an endoscopy last week which showed mild gastritis, was started on PPI twice a day and Carafate for this, despite this has continued to have nausea, dizziness, weakness and headaches, yesterday she was having some chest pain and hence presented to the emergency room and got admitted. Initial EKG showed some T-wave inversion in inferior leads which has since corrected  Review of Systems: As per HPI otherwise 14 and point review of systems negative.   Past Medical History:  Diagnosis Date  . Gluten intolerance   . Thyroid disease    hypothyroid    Past Surgical History:  Procedure Laterality Date  . CHOLECYSTECTOMY       reports that she has never smoked. She has never used smokeless tobacco. She reports that she does not drink alcohol or use drugs.  Allergies  Allergen Reactions  . Advil [Ibuprofen] Other (See Comments)    Kidney dysfunction  . Gluten Meal   . Penicillins Hives    And swelling    Family History  Problem Relation Age of Onset  . Hypertension Mother   . Heart failure Father 42     Prior to Admission medications   Medication Sig Start Date End Date Taking? Authorizing  Provider  levothyroxine (SYNTHROID, LEVOTHROID) 125 MCG tablet Take 125 mcg by mouth daily before breakfast.   Yes [provider]  LORazepam (ATIVAN) 0.5 MG tablet Take 0.5 mg by mouth every 12 (twelve) hours as needed for anxiety.  05/27/18  Yes [provider]  ondansetron (ZOFRAN-ODT) 8 MG disintegrating tablet Take 8 mg by mouth every 8 (eight) hours as needed for nausea or vomiting.   Yes [provider]  pantoprazole (PROTONIX) 40 MG tablet Take 40 mg by mouth 2 (two) times daily.   Yes [provider]  sucralfate (CARAFATE) 1 g tablet Take 1 g by mouth 4 (four) times daily.   Yes [provider]  ARMOUR THYROID 60 MG tablet Take 0.5 tablets (30 mg total) by mouth daily before breakfast. 06/06/18   Carlus Pavlov, MD  ARMOUR THYROID 90 MG tablet Take 0.5 tablets (45 mg total) by mouth daily. 06/06/18   Carlus Pavlov, MD    Physical Exam: Vitals:   06/19/18 0600 06/19/18 0641 06/19/18 0730 06/19/18 0800  BP: 99/76 102/75 104/73 96/68  Pulse: 65 81 72 60  Resp: 11 16 13 12   Temp:      TempSrc:      SpO2: 99% 100% 99% 98%      Constitutional: NAD, calm, comfortable, no distress Vitals:   06/19/18 0600 06/19/18 0641 06/19/18 0730 06/19/18 0800  BP: 99/76 102/75 104/73 96/68  Pulse: 65 81 72 60  Resp: 11 16 13 12   Temp:      TempSrc:  SpO2: 99% 100% 99% 98%   Eyes: PERRL, lids and conjunctivae normal ENMT: Mucous membranes are dry Neck: normal, supple Respiratory: clear to auscultation bilaterally Cardiovascular: Regular rate and rhythm, no murmurs / rubs / gallops Abdomen: soft, non tender, Bowel sounds positive.  Musculoskeletal: No joint deformity upper and lower extremities. Ext: no edema Skin: no rashes, lesions, ulcers.  Neurologic: CN 2-12 grossly intact. Sensation intact, DTR normal. Strength 5/5 in all 4.  Psychiatric: Normal judgment and insight. Alert and oriented x 3. Normal mood.   Labs on Admission: I  have personally reviewed following labs and imaging studies  CBC: Recent Labs  Lab 06/18/18 1541  WBC 6.5  NEUTROABS 3.9  HGB 14.6  HCT 43.5  MCV 90.6  PLT 210   Basic Metabolic Panel: Recent Labs  Lab 06/18/18 1541  NA 143  K 4.1  CL 105  CO2 29  GLUCOSE 100*  BUN 11  CREATININE 0.69  CALCIUM 9.6   GFR: Estimated Creatinine Clearance: 93.3 mL/min (by C-G formula based on SCr of 0.69 mg/dL). Liver Function Tests: Recent Labs  Lab 06/18/18 1541  AST 12*  ALT 14  ALKPHOS 35*  BILITOT 0.9  PROT 7.4  ALBUMIN 4.2   Recent Labs  Lab 06/18/18 1541  LIPASE 33   No results for input(s): AMMONIA in the last 168 hours. Coagulation Profile: No results for input(s): INR, PROTIME in the last 168 hours. Cardiac Enzymes: Recent Labs  Lab 06/19/18 0134 06/19/18 0734  TROPONINI <0.03 <0.03   BNP (last 3 results) No results for input(s): PROBNP in the last 8760 hours. HbA1C: Recent Labs    06/19/18 0134  HGBA1C 4.9   CBG: No results for input(s): GLUCAP in the last 168 hours. Lipid Profile: Recent Labs    06/19/18 0134  CHOL 121  HDL 32*  LDLCALC 74  TRIG 77  CHOLHDL 3.8   Thyroid Function Tests: No results for input(s): TSH, T4TOTAL, FREET4, T3FREE, THYROIDAB in the last 72 hours. Anemia Panel: No results for input(s): VITAMINB12, FOLATE, FERRITIN, TIBC, IRON, RETICCTPCT in the last 72 hours. Urine analysis:    Component Value Date/Time   COLORURINE STRAW (A) 06/18/2018 1906   APPEARANCEUR CLEAR 06/18/2018 1906   LABSPEC 1.006 06/18/2018 1906   PHURINE 6.0 06/18/2018 1906   GLUCOSEU NEGATIVE 06/18/2018 1906   HGBUR NEGATIVE 06/18/2018 1906   BILIRUBINUR NEGATIVE 06/18/2018 1906   KETONESUR 5 (A) 06/18/2018 1906   PROTEINUR NEGATIVE 06/18/2018 1906   NITRITE NEGATIVE 06/18/2018 1906   LEUKOCYTESUR NEGATIVE 06/18/2018 1906   Sepsis Labs: @LABRCNTIP (procalcitonin:4,lacticidven:4) ) Recent Results (from the past 240 hour(s))  Wet prep,  genital     Status: Abnormal   Collection Time: 06/10/18  7:08 PM  Result Value Ref Range Status   Yeast Wet Prep HPF POC NONE SEEN NONE SEEN Final   Trich, Wet Prep NONE SEEN NONE SEEN Final   Clue Cells Wet Prep HPF POC NONE SEEN NONE SEEN Final   WBC, Wet Prep HPF POC MANY (A) NONE SEEN Final   Sperm NONE SEEN  Final    Comment: Performed at [facility], [address] [place], [place] [address]     Radiological Exams on Admission: Dg Chest 2 View  Result Date: 06/18/2018 CLINICAL DATA:  Shortness of breath and chest tightness EXAM: CHEST - 2 VIEW COMPARISON:  None. FINDINGS: Lungs are clear. Heart size and pulmonary vascularity are normal. No adenopathy. No pneumothorax. No bone lesions. IMPRESSION: No edema or consolidation.  Electronically Signed   By: Bretta Bang III M.D.   On: 06/18/2018 16:48   Ct Angio Chest Pe W/cm &/or Wo Cm  Result Date: 06/19/2018 CLINICAL DATA:  Chest tightness beginning 2 days ago. EXAM: CT ANGIOGRAPHY CHEST WITH CONTRAST TECHNIQUE: Multidetector CT imaging of the chest was performed using the standard protocol during bolus administration of intravenous contrast. Multiplanar CT image reconstructions and MIPs were obtained to evaluate the vascular anatomy. CONTRAST:  ISOVUE-370 IOPAMIDOL (ISOVUE-370) INJECTION 76% COMPARISON:  Same day CXR FINDINGS: Cardiovascular: Aberrant right subclavian artery, an anatomic variant. No acute pulmonary embolus to the segmental level. Nonaneurysmal thoracic aorta. Heart size is normal without pericardial effusion or thickening. Mediastinum/Nodes: No enlarged mediastinal, hilar, or axillary lymph nodes. Thyroid gland, trachea, and esophagus demonstrate no significant findings. Lungs/Pleura: Lungs are clear. No pleural effusion or pneumothorax. Upper Abdomen: Cholecystectomy.  No acute abnormality. Musculoskeletal: No chest wall abnormality. No acute or significant osseous findings. Review of the MIP images confirms  the above findings. IMPRESSION: No acute pulmonary embolus, aortic aneurysm or dissection. Aberrant right subclavian artery. Electronically Signed   By: Tollie Eth M.D.   On: 06/19/2018 00:23    EKG: Independently reviewed. NSR, no acute ST T wave changes, TWI in inferior leads seen earlier today has resolved  Assessment/Plan Nausea, weakness, dizziness and headaches -ongoing above nonspecific symptoms for the last 2 weeks -History of cholecystectomy, LFTs, lipase, CBC, all unremarkable -CT abdomen pelvis last week and CT chest last night were benign -2-D echocardiogram completed and pending -Will check MRI brain, also check cosyntropin stim test    Hypothyroidism due to Hashimoto's thyroiditis -continue home dose of Synthroid    Chest pain -Likely due to GERD/gastritis -recent EGD with mild gastritis only -FU ECHO  DVT prophylaxis: lovenox Code Status: Full Code Family Communication: spouse at bedside Disposition Plan: Home pending above workup  Consults called: Cardiology per EDP Admission status: Observation   Zannie Cove MD [facility]  If 7PM-7AM, please contact night-coverage [email or web address] Password [email or web address]  06/19/2018, 11:11 AM

## 2018-06-19 NOTE — ED Notes (Signed)
While checking on pt, pt states that she is having discomfort in the chest but refuses to take nitro and morphine and would like to just wait it out.

## 2018-06-19 NOTE — Progress Notes (Signed)
  Echocardiogram 2D Echocardiogram has been performed.  Kara Patton 06/19/2018, 10:13 AM

## 2018-06-19 NOTE — Care Management (Signed)
This is a no charge note  Pending admission per Dr. Alvin Critchley  37 year old lady with past medical history for gluten intolerance, GERD, hypothyroidism, anxiety,who presents with chest pain and near syncope. CTA is negative for PE.  EKG showed T-wave inversion in inferior leads, V5-V6. Trop negative in ED. Pt was seen here on the 26th of August for abdominal pain and followed up with GI specialists. She had EGD and was started with Protonix and Carafate. Pt is placed in tele bed for obs. Card was consulted.   Lorretta Harp, MD  [facility] Pager [phone]  If 7PM-7AM, please contact night-coverage [email or web address] Password [email or web address] 06/19/2018, 4:07 AM

## 2018-06-19 NOTE — ED Provider Notes (Signed)
[facility] EMERGENCY DEPARTMENT Provider Note   CSN: [id number] Arrival date & time: 06/18/18  1501     History   Chief Complaint Chief Complaint  Patient presents with  . Chest Pain  . Abdominal Pain    HPI Austin Pongratz is a 37 y.o. female with a past medical history of Hashimoto's thyroiditis, gluten intolerance, who presents today for evaluation of chest pain, decreased exercise tolerance, along with multiple presyncopal events.  She was seen in the ER on 8/26 for similar symptoms, she has followed up with GI who performed an endoscopy and are waiting on biopsy results.  She is primarily concerned with her chest pain, shortness of breath.  She reports that these have been severe to the point that she is unable to stand up long enough to cook her kids breakfast, unable to walk for the supermarket without nearly passing out and has had multiple near syncopal events.    HPI  Past Medical History:  Diagnosis Date  . Gluten intolerance   . Thyroid disease    hypothyroid    Patient Active Problem List   Diagnosis Date Noted  . Hypothyroidism due to Hashimoto's thyroiditis 06/06/2018  . Multiple thyroid nodules 06/06/2018    Past Surgical History:  Procedure Laterality Date  . CHOLECYSTECTOMY       OB History   None      Home Medications    Prior to Admission medications   Medication Sig Start Date End Date Taking? Authorizing Provider  levothyroxine (SYNTHROID, LEVOTHROID) 125 MCG tablet Take 125 mcg by mouth daily before breakfast.   Yes [provider]  LORazepam (ATIVAN) 0.5 MG tablet Take 0.5 mg by mouth every 12 (twelve) hours as needed for anxiety.  05/27/18  Yes [provider]  ondansetron (ZOFRAN-ODT) 8 MG disintegrating tablet Take 8 mg by mouth every 8 (eight) hours as needed for nausea or vomiting.   Yes [provider]  pantoprazole (PROTONIX) 40 MG tablet Take 40 mg by mouth 2 (two) times daily.   Yes [provider]  sucralfate (CARAFATE) 1 g tablet Take 1 g by mouth 4 (four) times daily.   Yes [provider]  ARMOUR THYROID 60 MG tablet Take 0.5 tablets (30 mg total) by mouth daily before breakfast. 06/06/18   Philemon Kingdom, MD  ARMOUR THYROID 90 MG tablet Take 0.5 tablets (45 mg total) by mouth daily. 06/06/18   Philemon Kingdom, MD    Family History History reviewed. No pertinent family history.  Social History Social History   Tobacco Use  . Smoking status: Never Smoker  . Smokeless tobacco: Never Used  Substance Use Topics  . Alcohol use: Never    Frequency: Never  . Drug use: Never     Allergies   Advil [ibuprofen] and Penicillins   Review of Systems Review of Systems  Constitutional: Negative for chills and fever.  HENT: Negative for ear pain and sore throat.   Eyes: Negative for pain and visual disturbance.  Respiratory: Positive for chest tightness and shortness of breath. Negative for cough.   Cardiovascular: Negative for chest pain and palpitations.  Gastrointestinal: Positive for nausea. Negative for abdominal pain and vomiting.  Genitourinary: Negative for dysuria and hematuria.  Musculoskeletal: Negative for arthralgias, back pain, neck pain and neck stiffness.  Skin: Negative for color change and rash.  Neurological: Positive for light-headedness and headaches. Negative for seizures and syncope (Near syncope).  All other systems reviewed and are negative.  Physical Exam Updated Vital Signs BP 101/73   Pulse 76   Temp 98 F (36.7 C) (Oral)   Resp 13   LMP 06/07/2018   SpO2 100%   Physical Exam  Constitutional: She appears well-developed and well-nourished.  Non-toxic appearance. No distress.  HENT:  Head: Normocephalic and atraumatic.  Eyes: Conjunctivae are normal.  Neck: Normal range of motion. Neck supple.  Cardiovascular: Normal rate, regular rhythm, intact distal pulses and normal pulses.  No murmur  heard. Pulmonary/Chest: Effort normal and breath sounds normal. No accessory muscle usage or stridor. No tachypnea. No respiratory distress. She has no decreased breath sounds.  Abdominal: Soft. There is no tenderness.  Musculoskeletal: She exhibits no edema.       Right lower leg: Normal.       Left lower leg: Normal.  TTP over bilateral cervical paraspinal muscles.  Palpation over this area re-creates her posterior head pain.  Neurological: She is alert.  Skin: Skin is warm and dry.  Psychiatric: She has a normal mood and affect.  Nursing note and vitals reviewed.    ED Treatments / Results  Labs (all labs ordered are listed, but only abnormal results are displayed) Labs Reviewed  CBC WITH DIFFERENTIAL/PLATELET - Abnormal; Notable for the following components:      Result Value   RDW 11.1 (*)    All other components within normal limits  COMPREHENSIVE METABOLIC PANEL - Abnormal; Notable for the following components:   Glucose, Bld 100 (*)    AST 12 (*)    Alkaline Phosphatase 35 (*)    All other components within normal limits  URINALYSIS, ROUTINE W REFLEX MICROSCOPIC - Abnormal; Notable for the following components:   Color, Urine STRAW (*)    Ketones, ur 5 (*)    All other components within normal limits  LIPASE, BLOOD  I-STAT BETA HCG BLOOD, ED ([facility], [facility], [facility] ONLY)  I-STAT TROPONIN, ED  I-STAT TROPONIN, ED    EKG EKG Interpretation  Date/Time:  Tuesday June 18 2018 23:07:50 EDT Ventricular Rate:  68 PR Interval:  138 QRS Duration: 95 QT Interval:  424 QTC Calculation: 451 R Axis:   79 Text Interpretation:  Sinus rhythm ST depression has resolved Confirmed by Malvin Johns [id number]) on 06/18/2018 11:29:28 PM  Initial EKG: Normal sinus rhythm with sinus arrhythmia T wave abnormality, consider inferior ischemia T wave abnormality, consider anterolateral ischemia Abnormal ECG No old tracing to compare Confirmed by Malvin Johns [phone]) on 06/18/2018 10:16:24  PM  Radiology Dg Chest 2 View  Result Date: 06/18/2018 CLINICAL DATA:  Shortness of breath and chest tightness EXAM: CHEST - 2 VIEW COMPARISON:  None. FINDINGS: Lungs are clear. Heart size and pulmonary vascularity are normal. No adenopathy. No pneumothorax. No bone lesions. IMPRESSION: No edema or consolidation. Electronically Signed   By: Lowella Grip III M.D.   On: 06/18/2018 16:48   Ct Angio Chest Pe W/cm &/or Wo Cm  Result Date: 06/19/2018 CLINICAL DATA:  Chest tightness beginning 2 days ago. EXAM: CT ANGIOGRAPHY CHEST WITH CONTRAST TECHNIQUE: Multidetector CT imaging of the chest was performed using the standard protocol during bolus administration of intravenous contrast. Multiplanar CT image reconstructions and MIPs were obtained to evaluate the vascular anatomy. CONTRAST:  141m ISOVUE-370 IOPAMIDOL (ISOVUE-370) INJECTION 76% COMPARISON:  Same day CXR FINDINGS: Cardiovascular: Aberrant right subclavian artery, an anatomic variant. No acute pulmonary embolus to the segmental level. Nonaneurysmal thoracic aorta. Heart size is normal without pericardial effusion or thickening. Mediastinum/Nodes: No enlarged mediastinal,  hilar, or axillary lymph nodes. Thyroid gland, trachea, and esophagus demonstrate no significant findings. Lungs/Pleura: Lungs are clear. No pleural effusion or pneumothorax. Upper Abdomen: Cholecystectomy.  No acute abnormality. Musculoskeletal: No chest wall abnormality. No acute or significant osseous findings. Review of the MIP images confirms the above findings. IMPRESSION: No acute pulmonary embolus, aortic aneurysm or dissection. Aberrant right subclavian artery. Electronically Signed   By: Ashley Royalty M.D.   On: 06/19/2018 00:23    Procedures Procedures (including critical care time)  2059  Orthostatic Vital Signs Orthostatic Lying  BP- Lying:111/85 Pulse- Lying:63 Orthostatic Sitting BP- Sitting:108/86 Pulse- Sitting:74 Orthostatic Standing at 0 minutes BP-  Standing at 0 minutes:105/77 Pulse- Standing at 0 minutes:103   0045  Orthostatic Vital Signs Orthostatic Lying  BP- Lying:101/66 Pulse- Lying:64 Orthostatic Sitting BP- Sitting:113/84 Pulse- Sitting:81 Orthostatic Standing at 0 minutes BP- Standing at 0 minutes:111/91 Pulse- Standing at 0 minutes:95   Second set of orthostatic vital signs was obtained after patient given 2 L IV fluids.  Patient unable to stand for repeat standing vitals due to near syncopal event.  Medications Ordered in ED Medications  iopamidol (ISOVUE-370) 76 % injection (has no administration in time range)  sodium chloride 0.9 % bolus 2,000 mL (2,000 mLs Intravenous New Bag/Given 06/18/18 2323)  ondansetron (ZOFRAN) injection 4 mg (4 mg Intravenous Given 06/18/18 2333)  acetaminophen (TYLENOL) tablet 650 mg (650 mg Oral Given 06/18/18 2329)  iopamidol (ISOVUE-370) 76 % injection 100 mL (100 mLs Intravenous Contrast Given 06/18/18 2343)     Initial Impression / Assessment and Plan / ED Course  I have reviewed the triage vital signs and the nursing notes.  Pertinent labs & imaging results that were available during my care of the patient were reviewed by me and considered in my medical decision making (see chart for details).  Clinical Course as of Jun 19 137  Wed Jun 19, 2018  0058 Patient has had 2 L of IV fluids, attempted orthostatics, however she is extremely orthostatic becomes very symptomatic with near syncopal event when standing.  Unable to ambulate patient.   [EH]  0131 I spoke with cardiology who will come see the patient.  I spoke with Dr. Blaine Hamper who will come admit the patient.    [EH]  1017 Spoke with cardiologist Dr. Gilmore Laroche who asked to add on ESR, CRP.  He will order echo.    [EH]    Clinical Course User Index [EH] Lorin Glass, PA-C   Gianelle Mccaul presents today for evaluation of chest pain, shortness of breath, multiple near syncopal events.  Her symptoms started approximately 10 to  12 days ago after she had a GI bug with nausea vomiting and diarrhea.  Her vomiting and diarrhea have resolved, she is not she has had an endoscopy by GI reportedly showing diffuse stomach irritation without ulcers.  Patient was orthostatic upon arrival, and given 2 L of IV fluids.  Based on her chest pain, decreased exercise tolerance along with multiple near syncopal events concern for PE.  CT angios was obtained without evidence of PE or other intrathoracic abnormalities.  She had negative troponins x2.  After 2 L of IV fluid she still remained orthostatic, was unable to ambulate or stand for more than 1 to 2 minutes as she had near syncopal events.  Her initial EKG did show ST depressions in the inferior, and anterior lateral leads.  This resolved on later EKGs.  Given that she appears to have dynamic EKG changes along  with chest pain and significant orthostasis cardiology was consulted who recommended admission, along with obtaining ESR and CRP.  I spoke with Dr. Blaine Hamper from hospitalist who agreed to admit patient.  Final Clinical Impressions(s) / ED Diagnoses   Final diagnoses:  Abdominal pain, unspecified abdominal location  Chest pain, unspecified type    ED Discharge Orders    None       Lorin Glass, PA-C 06/19/18 0145    Malvin Johns, MD 06/19/18 1550

## 2018-06-19 NOTE — Consult Note (Signed)
Cardiology Consultation Note    Patient ID: Kara Patton, MRN: [id number], DOB/AGE: August 06, 1981 37 y.o. Admit date: 06/18/2018   Date of Consult: 06/19/2018 Primary Physician: Lanice Shirts, MD Primary Cardiologist: None  Chief Complaint: Chest Pain, Dizziness Reason for Consultation: EKG abnormality Requesting MD: Wyn Quaker PA  HPI: Kara Patton is a 37 y.o. female with a history of Hashimoto's who presented with intermittent chest pain and difficult breathing as well as additional symptoms including GI upset who was noted to have t wave inversions. Cardiology consulted for workup.   Patient has had 2 negative troponins but had concerning EKG changes when patient was tachycardic. Does not have a cardiac history per se but certainly EKG concerning. Also has chest pain but troponins normal. Last child birth 4 years ago.   Had triple rule out CT, no calcifications. Recent CT abd pelvis also without abnormalities.   Past Medical History:  Diagnosis Date  . Gluten intolerance   . Thyroid disease    hypothyroid      Surgical History:  Past Surgical History:  Procedure Laterality Date  . CHOLECYSTECTOMY       Home Meds: Prior to Admission medications   Medication Sig Start Date End Date Taking? Authorizing Provider  levothyroxine (SYNTHROID, LEVOTHROID) 125 MCG tablet Take 125 mcg by mouth daily before breakfast.   Yes [provider]  LORazepam (ATIVAN) 0.5 MG tablet Take 0.5 mg by mouth every 12 (twelve) hours as needed for anxiety.  05/27/18  Yes [provider]  ondansetron (ZOFRAN-ODT) 8 MG disintegrating tablet Take 8 mg by mouth every 8 (eight) hours as needed for nausea or vomiting.   Yes [provider]  pantoprazole (PROTONIX) 40 MG tablet Take 40 mg by mouth 2 (two) times daily.   Yes [provider]  sucralfate (CARAFATE) 1 g tablet Take 1 g by mouth 4 (four) times daily.   Yes [provider]  ARMOUR THYROID 60 MG tablet  Take 0.5 tablets (30 mg total) by mouth daily before breakfast. 06/06/18   Philemon Kingdom, MD  ARMOUR THYROID 90 MG tablet Take 0.5 tablets (45 mg total) by mouth daily. 06/06/18   Philemon Kingdom, MD    Inpatient Medications:  . iopamidol       . sodium chloride      Allergies:  Allergies  Allergen Reactions  . Advil [Ibuprofen] Other (See Comments)    Kidney dysfunction  . Penicillins Hives    And swelling    Social History   Socioeconomic History  . Marital status: Married    Spouse name: Not on file  . Number of children: Not on file  . Years of education: Not on file  . Highest education level: Not on file  Occupational History  . Not on file  Social Needs  . Financial resource strain: Not on file  . Food insecurity:    Worry: Not on file    Inability: Not on file  . Transportation needs:    Medical: Not on file    Non-medical: Not on file  Tobacco Use  . Smoking status: Never Smoker  . Smokeless tobacco: Never Used  Substance and Sexual Activity  . Alcohol use: Never    Frequency: Never  . Drug use: Never  . Sexual activity: Yes  Lifestyle  . Physical activity:    Days per week: Not on file    Minutes per session: Not on file  . Stress: Not on file  Relationships  .  Social connections:    Talks on phone: Not on file    Gets together: Not on file    Attends religious service: Not on file    Active member of club or organization: Not on file    Attends meetings of clubs or organizations: Not on file    Relationship status: Not on file  . Intimate partner violence:    Fear of current or ex partner: Not on file    Emotionally abused: Not on file    Physically abused: Not on file    Forced sexual activity: Not on file  Other Topics Concern  . Not on file  Social History Narrative  . Not on file     History reviewed. No pertinent family history. No family history of premature cad.   Review of Systems: General: negative for chills, fever,  night sweats or weight changes.  Cardiovascular: negative for chest pain, edema, orthopnea, palpitations, paroxysmal nocturnal dyspnea, shortness of breath or dyspnea on exertion Dermatological: negative for rash Respiratory: negative for cough or wheezing Urologic: negative for hematuria Abdominal: negative for nausea, vomiting, diarrhea, bright red blood per rectum, melena, or hematemesis Neurologic: negative for visual changes, syncope, or dizziness All other systems reviewed and are otherwise negative except as noted above.  Labs: No results for input(s): CKTOTAL, CKMB, TROPONINI in the last 72 hours. Lab Results  Component Value Date   WBC 6.5 06/18/2018   HGB 14.6 06/18/2018   HCT 43.5 06/18/2018   MCV 90.6 06/18/2018   PLT 210 06/18/2018    Recent Labs  Lab 06/18/18 1541  NA 143  K 4.1  CL 105  CO2 29  BUN 11  CREATININE 0.69  CALCIUM 9.6  PROT 7.4  BILITOT 0.9  ALKPHOS 35*  ALT 14  AST 12*  GLUCOSE 100*   No results found for: CHOL, HDL, LDLCALC, TRIG No results found for: DDIMER  Radiology/Studies:  Dg Chest 2 View  Result Date: 06/18/2018 CLINICAL DATA:  Shortness of breath and chest tightness EXAM: CHEST - 2 VIEW COMPARISON:  None. FINDINGS: Lungs are clear. Heart size and pulmonary vascularity are normal. No adenopathy. No pneumothorax. No bone lesions. IMPRESSION: No edema or consolidation. Electronically Signed   By: Lowella Grip III M.D.   On: 06/18/2018 16:48   Ct Angio Chest Pe W/cm &/or Wo Cm  Result Date: 06/19/2018 CLINICAL DATA:  Chest tightness beginning 2 days ago. EXAM: CT ANGIOGRAPHY CHEST WITH CONTRAST TECHNIQUE: Multidetector CT imaging of the chest was performed using the standard protocol during bolus administration of intravenous contrast. Multiplanar CT image reconstructions and MIPs were obtained to evaluate the vascular anatomy. CONTRAST:  178m ISOVUE-370 IOPAMIDOL (ISOVUE-370) INJECTION 76% COMPARISON:  Same day CXR FINDINGS:  Cardiovascular: Aberrant right subclavian artery, an anatomic variant. No acute pulmonary embolus to the segmental level. Nonaneurysmal thoracic aorta. Heart size is normal without pericardial effusion or thickening. Mediastinum/Nodes: No enlarged mediastinal, hilar, or axillary lymph nodes. Thyroid gland, trachea, and esophagus demonstrate no significant findings. Lungs/Pleura: Lungs are clear. No pleural effusion or pneumothorax. Upper Abdomen: Cholecystectomy.  No acute abnormality. Musculoskeletal: No chest wall abnormality. No acute or significant osseous findings. Review of the MIP images confirms the above findings. IMPRESSION: No acute pulmonary embolus, aortic aneurysm or dissection. Aberrant right subclavian artery. Electronically Signed   By: DAshley RoyaltyM.D.   On: 06/19/2018 00:23   Ct Abdomen Pelvis W Contrast  Result Date: 06/10/2018 CLINICAL DATA:  Right lower quadrant pain, nausea EXAM: CT ABDOMEN  AND PELVIS WITH CONTRAST TECHNIQUE: Multidetector CT imaging of the abdomen and pelvis was performed using the standard protocol following bolus administration of intravenous contrast. CONTRAST:  152m ISOVUE-300 IOPAMIDOL (ISOVUE-300) INJECTION 61% COMPARISON:  None. FINDINGS: Lower chest: Lung bases are clear. No effusions. Heart is normal size. Hepatobiliary: No focal liver abnormality is seen. Status post cholecystectomy. No biliary dilatation. Pancreas: No focal abnormality or ductal dilatation. Spleen: No focal abnormality.  Normal size. Adrenals/Urinary Tract: No adrenal abnormality. No focal renal abnormality. No stones or hydronephrosis. Urinary bladder is unremarkable. Stomach/Bowel: Normal appendix. Stomach, large and small bowel grossly unremarkable. Vascular/Lymphatic: No evidence of aneurysm or adenopathy. Reproductive: Uterus and adnexa unremarkable.  No mass. Other: No free fluid or free air. Musculoskeletal: No acute bony abnormality or focal bone lesion. IMPRESSION: Prior  cholecystectomy. Normal appendix. No acute findings in the abdomen or pelvis. Electronically Signed   By: KRolm BaptiseM.D.   On: 06/10/2018 19:47    Wt Readings from Last 3 Encounters:  06/06/18 68.5 kg    EKG: First EKG noted T wave inversion ins inferior leads and in precordial leads, improved in subsequent EKGs  Physical Exam: Blood pressure 101/73, pulse 76, temperature 98 F (36.7 C), temperature source Oral, resp. rate 13, last menstrual period 06/07/2018, SpO2 100 %. There is no height or weight on file to calculate BMI. General: Well developed, well nourished, in no acute distress. Head: Normocephalic, atraumatic, sclera non-icteric, no xanthomas, nares are without discharge.  Neck: Negative for carotid bruits. JVD not elevated. Lungs: Clear bilaterally to auscultation without wheezes, rales, or rhonchi. Breathing is unlabored. Heart: RRR with S1 S2. No murmurs, rubs, or gallops appreciated. Abdomen: Soft, non-tender, non-distended with normoactive bowel sounds. No hepatomegaly. No rebound/guarding. No obvious abdominal masses. Msk:  Strength and tone appear normal for age. Extremities: No clubbing or cyanosis. No edema.  Distal pedal pulses are 2+ and equal bilaterally. Neuro: Alert and oriented X 3. No facial asymmetry. No focal deficit. Moves all extremities spontaneously. Psych:  Responds to questions appropriately with a normal affect.     Assessment and Plan  80F w/ chest pain and EKG changes. Differentials include pericarditis, coronary spasm, and less likely coronary dissection.  - admit to gen med service - tte in AM (ordered); may need additional testing depending on results - inflammatory markers (ESR, CRP) - maintain tele - serial EKGs  Signed, HGlynda Jaeger MD 06/19/2018, 2:00 AM

## 2018-06-19 NOTE — Progress Notes (Addendum)
Progress Note  Patient Name: Kara Patton Date of Encounter: 06/19/2018  Primary Cardiologist: New, Dr Jens Som  Subjective   CP is exertional, also at rest. Squeezing around the lower edge of her ribs, also pressure across her mid-chest. Associate w/ SOB.   Has not had since admitted.   Has not felt well in several weeks. Had an EGD, ?w/ gastritis, bx results not back yet.   Inpatient Medications    Scheduled Meds: . iopamidol      . levothyroxine  125 mcg Oral QAC breakfast  . pantoprazole  40 mg Oral BID  . sucralfate  1 g Oral TID AC & HS   Continuous Infusions: . sodium chloride 100 mL/hr at 06/19/18 0636   PRN Meds: acetaminophen, morphine injection, nitroGLYCERIN   Vital Signs    Vitals:   06/19/18 0600 06/19/18 0641 06/19/18 0730 06/19/18 0800  BP: 99/76 102/75 104/73 96/68  Pulse: 65 81 72 60  Resp: 11 16 13 12   Temp:      TempSrc:      SpO2: 99% 100% 99% 98%    Intake/Output Summary (Last 24 hours) at 06/19/2018 0858 Last data filed at 06/19/2018 0217 Gross per 24 hour  Intake 2000 ml  Output -  Net 2000 ml    Telemetry    SR, ST - Personally Reviewed  ECG    09/04 SR, HR 68, minimal RSR V1&S, ?nl variant, no acute ischemic changes - Personally Reviewed  Physical Exam   General: Well developed, well nourished, female appearing in no acute distress. Head: Normocephalic, atraumatic.  Neck: Supple without bruits, JVD not elevated. Lungs:  Resp regular and unlabored, CTA. Heart: RRR, S1, S2, no S3, S4, soft murmur; no rub. Abdomen: Soft, +tender, most tender RUQ, non-distended with normoactive bowel sounds. No hepatomegaly. No rebound, no guarding. No obvious abdominal masses. Extremities: No clubbing, cyanosis, no edema. Distal pedal pulses are 2+ bilaterally. Neuro: Alert and oriented X 3. Moves all extremities spontaneously. Psych: Normal affect.  Labs    Hematology Recent Labs  Lab 06/18/18 1541  WBC 6.5  RBC 4.80  HGB 14.6  HCT 43.5   MCV 90.6  MCH 30.4  MCHC 33.6  RDW 11.1*  PLT 210    Chemistry Recent Labs  Lab 06/18/18 1541  NA 143  K 4.1  CL 105  CO2 29  GLUCOSE 100*  BUN 11  CREATININE 0.69  CALCIUM 9.6  PROT 7.4  ALBUMIN 4.2  AST 12*  ALT 14  ALKPHOS 35*  BILITOT 0.9  GFRNONAA >60  GFRAA >60  ANIONGAP 9     Cardiac Enzymes Recent Labs  Lab 06/19/18 0134 06/19/18 0734  TROPONINI <0.03 <0.03    Recent Labs  Lab 06/18/18 1539 06/18/18 2257  TROPIPOC 0.02 0.02    Lab Results  Component Value Date   ESRSEDRATE 1 06/19/2018   CRP  Date Value Ref Range Status  06/19/2018 <0.8 <1.0 mg/dL Final    Comment:    Performed at [facility], [address], [place], [place] [address]   No results found for: TSH  Per Dr Charlean Sanfilippo note 05/27/2018: TSH 0.99, free T4 1.27 (0.61-1.12), free T3 3.22 (2.5-3.9)  Radiology    Dg Chest 2 View  Result Date: 06/18/2018 CLINICAL DATA:  Shortness of breath and chest tightness EXAM: CHEST - 2 VIEW COMPARISON:  None. FINDINGS: Lungs are clear. Heart size and pulmonary vascularity are normal. No adenopathy. No pneumothorax. No bone lesions. IMPRESSION: No edema  or consolidation. Electronically Signed   By: Bretta Bang III M.D.   On: 06/25/2018 16:48   Ct Angio Chest Pe W/cm &/or Wo Cm  Result Date: 06/19/2018 CLINICAL DATA:  Chest tightness beginning 2 days ago. EXAM: CT ANGIOGRAPHY CHEST WITH CONTRAST TECHNIQUE: Multidetector CT imaging of the chest was performed using the standard protocol during bolus administration of intravenous contrast. Multiplanar CT image reconstructions and MIPs were obtained to evaluate the vascular anatomy. CONTRAST:  ISOVUE-370 IOPAMIDOL (ISOVUE-370) INJECTION 76% COMPARISON:  Same day CXR FINDINGS: Cardiovascular: Aberrant right subclavian artery, an anatomic variant. No acute pulmonary embolus to the segmental level. Nonaneurysmal thoracic aorta. Heart size is normal without pericardial effusion or thickening.  Mediastinum/Nodes: No enlarged mediastinal, hilar, or axillary lymph nodes. Thyroid gland, trachea, and esophagus demonstrate no significant findings. Lungs/Pleura: Lungs are clear. No pleural effusion or pneumothorax. Upper Abdomen: Cholecystectomy.  No acute abnormality. Musculoskeletal: No chest wall abnormality. No acute or significant osseous findings. Review of the MIP images confirms the above findings. IMPRESSION: No acute pulmonary embolus, aortic aneurysm or dissection. Aberrant right subclavian artery. Electronically Signed   By: Tollie Eth M.D.   On: 06/19/2018 00:23    Cardiac Studies   ECHO:  ordered  Patient Profile     37 y.o. female w/ hx Hashimoto's thyroiditis, gluten intol, no hx DM/HTN/HLD, no FH premature CAD (F died CHF 105) was admitted 06-26-23 w/ CP/SOB, initially N&V w/ diarrhea but those sx improved. CP is tightness, intermittent, not exertional, associated w/ SOB. Also gets light-headed when up and around.   Assessment & Plan     Active Problems: 1.  Chest pain - cardiac ez neg MI and ECG not acute - Dr Bradly Chris reviewed the CT and did not see any coronary calcifications. - inflammatory markers are negative - further testing per MD, do not think she could do GXT  2. Light-headed:  - orthostatics done, BP ok, but HR increased significantly - she has been drinking more water to try and stay hydrated - would liberalize salt intake to see if that helps - review POTS criteria w/ MD - possibly related to thyroid/hormonal issues, eval per IM but will order a TSH.  SignedTheodore Demark , PA-C 8:58 AM 06/19/2018 Pager: [phone]  As above, patient seen and examined.  Patient presented with recent nausea, diarrhea.  She then began having dizziness with ambulating and standing.  She then complained of tightness in her rib area that can last days at a time.  Her initial electrocardiogram showed sinus with inferolateral T wave inversion.  Follow-up electrocardiogram  showed sinus with no ST changes.  Enzymes are negative.  We will await echocardiogram results.  If LV function is normal I do not think we would pursue further ischemia evaluation.  Agree with continued hydration.  30 min additional PA and physician time  Olga Millers, MD

## 2018-06-19 NOTE — ED Notes (Signed)
While pt was standing pt felt dizzy almost if pt was going to pass out, pt started to have tunnel vision and started to swaying back and forth and roaring in my ears. Pt than sat down and wanted to lay down because she didn't feel good. Pt states that she feels a little bit better and eyes are back to normal. While laying down after two minutes pt states she feel dizzy " whoozy". Pt states vision feels much better.

## 2018-06-20 DIAGNOSIS — R072 Precordial pain: Secondary | ICD-10-CM | POA: Diagnosis not present

## 2018-06-20 DIAGNOSIS — R079 Chest pain, unspecified: Secondary | ICD-10-CM | POA: Diagnosis not present

## 2018-06-20 DIAGNOSIS — R531 Weakness: Secondary | ICD-10-CM | POA: Diagnosis not present

## 2018-06-20 DIAGNOSIS — Z9049 Acquired absence of other specified parts of digestive tract: Secondary | ICD-10-CM | POA: Diagnosis not present

## 2018-06-20 DIAGNOSIS — Z79890 Hormone replacement therapy (postmenopausal): Secondary | ICD-10-CM | POA: Diagnosis not present

## 2018-06-20 DIAGNOSIS — E038 Other specified hypothyroidism: Secondary | ICD-10-CM | POA: Diagnosis not present

## 2018-06-20 DIAGNOSIS — R112 Nausea with vomiting, unspecified: Secondary | ICD-10-CM | POA: Diagnosis not present

## 2018-06-20 DIAGNOSIS — E063 Autoimmune thyroiditis: Secondary | ICD-10-CM | POA: Diagnosis not present

## 2018-06-20 DIAGNOSIS — R51 Headache: Secondary | ICD-10-CM | POA: Diagnosis not present

## 2018-06-20 DIAGNOSIS — F410 Panic disorder [episodic paroxysmal anxiety] without agoraphobia: Secondary | ICD-10-CM | POA: Diagnosis not present

## 2018-06-20 DIAGNOSIS — R42 Dizziness and giddiness: Secondary | ICD-10-CM | POA: Diagnosis not present

## 2018-06-20 DIAGNOSIS — R55 Syncope and collapse: Secondary | ICD-10-CM | POA: Diagnosis not present

## 2018-06-20 DIAGNOSIS — K9041 Non-celiac gluten sensitivity: Secondary | ICD-10-CM | POA: Diagnosis not present

## 2018-06-20 DIAGNOSIS — Z79899 Other long term (current) drug therapy: Secondary | ICD-10-CM | POA: Diagnosis not present

## 2018-06-20 DIAGNOSIS — R9431 Abnormal electrocardiogram [ECG] [EKG]: Secondary | ICD-10-CM | POA: Diagnosis not present

## 2018-06-20 LAB — HIV ANTIBODY (ROUTINE TESTING W REFLEX): HIV SCREEN 4TH GENERATION: NONREACTIVE

## 2018-06-20 LAB — COMPREHENSIVE METABOLIC PANEL
ALT: 11 U/L (ref 0–44)
ANION GAP: 8 (ref 5–15)
AST: 12 U/L — ABNORMAL LOW (ref 15–41)
Albumin: 3.7 g/dL (ref 3.5–5.0)
Alkaline Phosphatase: 29 U/L — ABNORMAL LOW (ref 38–126)
BILIRUBIN TOTAL: 0.7 mg/dL (ref 0.3–1.2)
BUN: 7 mg/dL (ref 6–20)
CO2: 27 mmol/L (ref 22–32)
Calcium: 9 mg/dL (ref 8.9–10.3)
Chloride: 106 mmol/L (ref 98–111)
Creatinine, Ser: 0.68 mg/dL (ref 0.44–1.00)
Glucose, Bld: 96 mg/dL (ref 70–99)
Potassium: 3.6 mmol/L (ref 3.5–5.1)
Sodium: 141 mmol/L (ref 135–145)
TOTAL PROTEIN: 6.3 g/dL — AB (ref 6.5–8.1)

## 2018-06-20 LAB — CBC
HEMATOCRIT: 41 % (ref 36.0–46.0)
Hemoglobin: 13.6 g/dL (ref 12.0–15.0)
MCH: 30.1 pg (ref 26.0–34.0)
MCHC: 33.2 g/dL (ref 30.0–36.0)
MCV: 90.7 fL (ref 78.0–100.0)
PLATELETS: 180 10*3/uL (ref 150–400)
RBC: 4.52 MIL/uL (ref 3.87–5.11)
RDW: 11.3 % — AB (ref 11.5–15.5)
WBC: 3.6 10*3/uL — ABNORMAL LOW (ref 4.0–10.5)

## 2018-06-20 LAB — ACTH STIMULATION, 3 TIME POINTS
CORTISOL BASE: 9 ug/dL
Cortisol, 30 Min: 20.5 ug/dL
Cortisol, 60 Min: 22.3 ug/dL

## 2018-06-20 NOTE — Discharge Summary (Signed)
Physician Discharge Summary  Kara Patton ZOX:[id number] DOB: [birth year] DOA: 06/18/2018  PCP: [name], MD  Admit date: 06/18/2018 Discharge date: 06/20/2018  Time spent: 65 minutes  Recommendations for Outpatient Follow-up:  1. Follow up with [name] for headache management 2. Follow up with Dr [name] in 1-2 weeks for clarification of when or if to restart thyroid armour 3. Follow up with PCP 1-2 weeks for general post hospitalization visit 4. Bland diet to be advanced by patient   Discharge Diagnoses:  Active Problems:   Hypothyroidism due to Hashimoto's thyroiditis   Chest pain   Nausea & vomiting   Dizziness   Discharge Condition: stable  Diet recommendation: regular  Filed Weights   06/20/18 0800  Weight: 67.1 kg    History of present illness:  37 year old lady with past medical history for gluten intolerance, GERD, hypothyroidism, anxiety,who presented with chest pain and near syncope on 06/19/18. CTA is negative for PE.  EKG showed T-wave inversion in inferior leads, V5-V6. Trop negative in ED. Pt was seen in ED on the 26th of August for abdominal pain and followed up with GI specialists. She had EGD and was started with Protonix and Carafate. Card was consulted  Hospital Course:  Nausea, weakness, dizziness and headaches -ongoing above nonspecific symptoms for the last 2 weeks -History of cholecystectomy, LFTs, lipase, CBC, all unremarkable -CT abdomen pelvis last week and CT chest last night were benign -2-D echocardiogram completed and within limits of normal -MRI brain without acute abnormality -cosyntropin stim test negative  Hypothyroidism due to Hashimoto's thyroiditis -continue home dose of Synthroid -chart review indicates synthroid armour ordered by Dr. [name] but patient call 9/3 and complained about anxiety and that she believed that the thyroid armour causing this. According to note Dr [name] stated that would be unusual for this med to  have this side effect but to be safe, recommended going back to synthroid.  Chest pain -Likely due to GERD/gastritis -echo shows normal LV function, CTA showed no pulmonary embolus, troponin negative, EKG normal with exception of one showing T wave inversion -recent EGD with mild gastritis only biopsies negative -evaluated by cardiology who opine not likely cardiac etiology and recommended no further cardiac work up needed. Did not think tilt table needed.  Procedures:  echo  Consultations:  [facility]  [facility]  Discharge Exam: Vitals:   06/20/18 0610 06/20/18 1121  BP: 115/79 112/75  Pulse: (!) 58 74  Resp: 17 16  Temp: 97.8 F (36.6 C) 97.9 F (36.6 C)  SpO2: 100% 100%    General: alert oriented x3. Walking in hall no acute distress Cardiovascular: rrr no mgr no LE edema Respiratory: normal effort BS clear bilaterally no wheeze  Discharge Instructions   Discharge Instructions    Call MD for:  difficulty breathing, headache or visual disturbances   Complete by:  As directed    Call MD for:  persistant nausea and vomiting   Complete by:  As directed    Diet - low sodium heart healthy   Complete by:  As directed    Discharge instructions   Complete by:  As directed    Follow up with Dr [name] for clarification of when/if to retry thyroid armour Follow up with [facility] 4-6 weeks Follow up with PCP for post hospitalization   Increase activity slowly   Complete by:  As directed      Allergies as of 06/20/2018      Reactions   Advil [ibuprofen]  Other (See Comments)   Kidney dysfunction   Gluten Meal    Penicillins Hives   And swelling      Medication List    STOP taking these medications   ARMOUR THYROID 60 MG tablet Generic drug:  thyroid   ARMOUR THYROID 90 MG tablet Generic drug:  thyroid     TAKE these medications   levothyroxine 125 MCG tablet Commonly known as:  SYNTHROID, LEVOTHROID Take 125 mcg by mouth daily  before breakfast.   LORazepam 0.5 MG tablet Commonly known as:  ATIVAN Take 0.5 mg by mouth every 12 (twelve) hours as needed for anxiety.   ondansetron 8 MG disintegrating tablet Commonly known as:  ZOFRAN-ODT Take 8 mg by mouth every 8 (eight) hours as needed for nausea or vomiting.   pantoprazole 40 MG tablet Commonly known as:  PROTONIX Take 40 mg by mouth 2 (two) times daily.   sucralfate 1 g tablet Commonly known as:  CARAFATE Take 1 g by mouth 4 (four) times daily.      Allergies  Allergen Reactions  . Advil [Ibuprofen] Other (See Comments)    Kidney dysfunction  . Gluten Meal   . Penicillins Hives    And swelling      The results of significant diagnostics from this hospitalization (including imaging, microbiology, ancillary and laboratory) are listed below for reference.    Significant Diagnostic Studies: Dg Chest 2 View  Result Date: 06/18/2018 CLINICAL DATA:  Shortness of breath and chest tightness EXAM: CHEST - 2 VIEW COMPARISON:  None. FINDINGS: Lungs are clear. Heart size and pulmonary vascularity are normal. No adenopathy. No pneumothorax. No bone lesions. IMPRESSION: No edema or consolidation. Electronically Signed   By: Bretta Bang III M.D.   On: 06/18/2018 16:48   Ct Angio Chest Pe W/cm &/or Wo Cm  Result Date: 06/19/2018 CLINICAL DATA:  Chest tightness beginning 2 days ago. EXAM: CT ANGIOGRAPHY CHEST WITH CONTRAST TECHNIQUE: Multidetector CT imaging of the chest was performed using the standard protocol during bolus administration of intravenous contrast. Multiplanar CT image reconstructions and MIPs were obtained to evaluate the vascular anatomy. CONTRAST:  ISOVUE-370 IOPAMIDOL (ISOVUE-370) INJECTION 76% COMPARISON:  Same day CXR FINDINGS: Cardiovascular: Aberrant right subclavian artery, an anatomic variant. No acute pulmonary embolus to the segmental level. Nonaneurysmal thoracic aorta. Heart size is normal without pericardial effusion or  thickening. Mediastinum/Nodes: No enlarged mediastinal, hilar, or axillary lymph nodes. Thyroid gland, trachea, and esophagus demonstrate no significant findings. Lungs/Pleura: Lungs are clear. No pleural effusion or pneumothorax. Upper Abdomen: Cholecystectomy.  No acute abnormality. Musculoskeletal: No chest wall abnormality. No acute or significant osseous findings. Review of the MIP images confirms the above findings. IMPRESSION: No acute pulmonary embolus, aortic aneurysm or dissection. Aberrant right subclavian artery. Electronically Signed   By: Tollie Eth M.D.   On: 06/19/2018 00:23   Mr Brain Wo Contrast  Result Date: 06/19/2018 CLINICAL DATA:  37 y/o F; headaches, weakness, nausea, vomiting, and dizziness for 2 weeks. History of Hashimoto's thyroiditis. EXAM: MRI HEAD WITHOUT CONTRAST TECHNIQUE: Multiplanar, multiecho pulse sequences of the brain and surrounding structures were obtained without intravenous contrast. COMPARISON:  None. FINDINGS: Brain: No acute infarction, hemorrhage, hydrocephalus, extra-axial collection or mass lesion. No structural or signal abnormality of the brain identified. Vascular: Normal flow voids. Skull and upper cervical spine: Normal marrow signal. Sinuses/Orbits: Negative. Other: None. IMPRESSION: Normal MRI of the brain. Electronically Signed   By: Mitzi Hansen M.D.   On: 06/19/2018 18:51  Ct Abdomen Pelvis W Contrast  Result Date: 06/10/2018 CLINICAL DATA:  Right lower quadrant pain, nausea EXAM: CT ABDOMEN AND PELVIS WITH CONTRAST TECHNIQUE: Multidetector CT imaging of the abdomen and pelvis was performed using the standard protocol following bolus administration of intravenous contrast. CONTRAST:  ISOVUE-300 IOPAMIDOL (ISOVUE-300) INJECTION 61% COMPARISON:  None. FINDINGS: Lower chest: Lung bases are clear. No effusions. Heart is normal size. Hepatobiliary: No focal liver abnormality is seen. Status post cholecystectomy. No biliary dilatation.  Pancreas: No focal abnormality or ductal dilatation. Spleen: No focal abnormality.  Normal size. Adrenals/Urinary Tract: No adrenal abnormality. No focal renal abnormality. No stones or hydronephrosis. Urinary bladder is unremarkable. Stomach/Bowel: Normal appendix. Stomach, large and small bowel grossly unremarkable. Vascular/Lymphatic: No evidence of aneurysm or adenopathy. Reproductive: Uterus and adnexa unremarkable.  No mass. Other: No free fluid or free air. Musculoskeletal: No acute bony abnormality or focal bone lesion. IMPRESSION: Prior cholecystectomy. Normal appendix. No acute findings in the abdomen or pelvis. Electronically Signed   By: Charlett Nose M.D.   On: 06/10/2018 19:47    Microbiology: Recent Results (from the past 240 hour(s))  Wet prep, genital     Status: Abnormal   Collection Time: 06/10/18  7:08 PM  Result Value Ref Range Status   Yeast Wet Prep HPF POC NONE SEEN NONE SEEN Final   Trich, Wet Prep NONE SEEN NONE SEEN Final   Clue Cells Wet Prep HPF POC NONE SEEN NONE SEEN Final   WBC, Wet Prep HPF POC MANY (A) NONE SEEN Final   Sperm NONE SEEN  Final    Comment: Performed at [facility], [address], [place], [place] [address]     Labs: Basic Metabolic Panel: Recent Labs  Lab 06/18/18 1541 06/19/18 1348 06/20/18 0703  NA 143  --  141  K 4.1  --  3.6  CL 105  --  106  CO2 29  --  27  GLUCOSE 100*  --  96  BUN 11  --  7  CREATININE 0.69 0.69 0.68  CALCIUM 9.6  --  9.0   Liver Function Tests: Recent Labs  Lab 06/18/18 1541 06/20/18 0703  AST 12* 12*  ALT 14 11  ALKPHOS 35* 29*  BILITOT 0.9 0.7  PROT 7.4 6.3*  ALBUMIN 4.2 3.7   Recent Labs  Lab 06/18/18 1541  LIPASE 33   No results for input(s): AMMONIA in the last 168 hours. CBC: Recent Labs  Lab 06/18/18 1541 06/19/18 1348 06/20/18 0703  WBC 6.5 4.7 3.6*  NEUTROABS 3.9  --   --   HGB 14.6 13.4 13.6  HCT 43.5 40.5 41.0  MCV 90.6 91.2 90.7  PLT 210 183 180   Cardiac  Enzymes: Recent Labs  Lab 06/19/18 0134 06/19/18 0734  TROPONINI <0.03 <0.03   BNP: BNP (last 3 results) No results for input(s): BNP in the last 8760 hours.  ProBNP (last 3 results) No results for input(s): PROBNP in the last 8760 hours.  CBG: No results for input(s): GLUCAP in the last 168 hours.     Signed:  Gwenyth Bender MD.  [facility] 06/20/2018, 4:51 PM

## 2018-06-20 NOTE — Consult Note (Addendum)
Cardiology Consultation:   Patient ID: Avaleen Patton; [id number]; [birth year]   Admit date: 06/18/2018 Date of Consult: 07-04-18  Primary Care Provider: [name], MD Primary Cardiologist: Dr Jens Som, 06/19/2018 Primary Electrophysiologist:  n/a   Patient Profile:   Kara Patton is a 37 y.o. female with a hx of Hashimoto's thyroiditis, gluten intol, no hx DM/HTN/HLD, no FH premature CAD (F died CHF 27) was admitted 07-05-2023 who is being seen today for the evaluation of chest pain at the request of Dr Jomarie Longs.  History of Present Illness:   Kara Patton was admitted 09/03-09/04 for chest pain. Seen by Dr Genice Rouge, echo nl, no coronary calcifications on PE CT, atypical sx w/out ECG or ez changes>>no further ischemic eval.   Kara Patton came back to the ER 09/04 pm with continued symptoms of nausea, diarrhea, dizziness, HA and weakness. Cards asked to see.   The chest pain that is a squeezing pain around her ribs has been continuous.  She has not had ECG changes or elevated enzymes.  She has episodic chest pain that she states is exertional.  It is like a grabbing pain either in the middle of her chest or on the left side of her upper chest.  It does resolve.  Being in the hospital the last couple of days, she has felt generally better.  She feels it is because of the rest that she is getting here.  However, she still feels very weak when she gets up.  She can get up to the bathroom unaided, but feels that if she walked down the hall, she would be too weak to walk back.   Past Medical History:  Diagnosis Date  . Dizzy 06/2018  . Gluten intolerance   . Headache   . Hypothyroidism   . Thyroid disease    hypothyroid    Past Surgical History:  Procedure Laterality Date  . CHOLECYSTECTOMY       Prior to Admission medications   Medication Sig Start Date End Date Taking? Authorizing Provider  levothyroxine (SYNTHROID, LEVOTHROID) 125 MCG tablet Take 125 mcg by mouth daily before  breakfast.   Yes [provider]  LORazepam (ATIVAN) 0.5 MG tablet Take 0.5 mg by mouth every 12 (twelve) hours as needed for anxiety.  05/27/18  Yes [provider]  ondansetron (ZOFRAN-ODT) 8 MG disintegrating tablet Take 8 mg by mouth every 8 (eight) hours as needed for nausea or vomiting.   Yes [provider]  pantoprazole (PROTONIX) 40 MG tablet Take 40 mg by mouth 2 (two) times daily.   Yes [provider]  sucralfate (CARAFATE) 1 g tablet Take 1 g by mouth 4 (four) times daily.   Yes [provider]  ARMOUR THYROID 60 MG tablet Take 0.5 tablets (30 mg total) by mouth daily before breakfast. 06/06/18   Carlus Pavlov, MD  ARMOUR THYROID 90 MG tablet Take 0.5 tablets (45 mg total) by mouth daily. 06/06/18   Carlus Pavlov, MD    Inpatient Medications: Scheduled Meds: . enoxaparin (LOVENOX) injection  40 mg Subcutaneous Q24H  . levothyroxine  125 mcg Oral QAC breakfast  . pantoprazole  40 mg Oral BID  . sucralfate  1 g Oral TID AC & HS   Continuous Infusions: . sodium chloride 50 mL/hr at 06/19/18 2219   PRN Meds: acetaminophen, gi cocktail, LORazepam, LORazepam, ondansetron (ZOFRAN) IV  Allergies:    Allergies  Allergen Reactions  . Advil [Ibuprofen] Other (See Comments)    Kidney dysfunction  .  Gluten Meal   . Penicillins Hives    And swelling    Social History:   Social History   Socioeconomic History  . Marital status: Married    Spouse name: Not on file  . Number of children: Not on file  . Years of education: Not on file  . Highest education level: Not on file  Occupational History  . 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 Financial resource strain: Not on file  . Food insecurity:    Worry: Not on file    Inability: Not on file  . Transportation needs:    Medical: Not on file    Non-medical: Not on file  Tobacco Use  . Smoking status: Never Smoker  . Smokeless tobacco: Never Used  Substance and  Sexual Activity  . Alcohol use: Never    Frequency: Never  . Drug use: Never  . Sexual activity: Yes  Lifestyle  . Physical activity:    Days per week: Not on file    Minutes per session: Not on file  . Stress: Not on file  Relationships  . Social connections:    Talks on phone: Not on file    Gets together: Not on file    Attends religious service: Not on file    Active member of club or organization: Not on file    Attends meetings of clubs or organizations: Not on file    Relationship status: Not on file  . Intimate partner violence:    Fear of current or ex partner: Not on file    Emotionally abused: Not on file    Physically abused: Not on file    Forced sexual activity: Not on file  Other Topics Concern  . Not on file  Social History Narrative   Lives in [place] w/ husband and 2 kids.    Family History:   Family History  Problem Relation Age of Onset  . Hypertension Mother   . Heart failure Father 63   Family Status:  Family Status  Relation Name Status  . Mother  Alive  . Father  Deceased    ROS:  Please see the history of present illness.  All other ROS reviewed and negative.     Physical Exam/Data:   Vitals:   06/19/18 1149 06/19/18 1728 06/20/18 0142 06/20/18 0610  BP: 102/84 94/63 116/75 115/79  Pulse: 68 72 61 (!) 58  Resp: 14 14 15 17   Temp: 98.1 F (36.7 C) 98.2 F (36.8 C) 98.1 F (36.7 C) 97.8 F (36.6 C)  TempSrc: Oral Oral Oral Oral  SpO2: 98% 96% 99% 100%   No intake or output data in the 24 hours ending 06/20/18 0716 There were no vitals filed for this visit. There is no height or weight on file to calculate BMI.  General:  Well nourished, well developed, in no acute distress HEENT: normal Lymph: no adenopathy Neck: no JVD Endocrine:  No thryomegaly Vascular: No carotid bruits; 4/4 extremity pulses 2+, without bruits  Cardiac:  normal S1, S2; RRR; no murmur  Lungs:  clear to auscultation bilaterally, no wheezing, rhonchi or rales    Abd: soft, nontender, no hepatomegaly  Ext: no edema Musculoskeletal:  No deformities, BUE and BLE strength normal and equal Skin: warm and dry  Neuro:  CNs 2-12 intact, no focal abnormalities noted Psych:  Normal affect   EKG:  The EKG was personally reviewed and demonstrates:  09/03, diffuse ST/T changes, 09/03 ECG w/ ST/T changes  resolved Telemetry:  Telemetry was personally reviewed and demonstrates:  SR, no acute changes  Relevant CV Studies:  ECHO: 06/19/2018 - Left ventricle: The cavity size was normal. Wall thickness was   normal. Systolic function was normal. The estimated ejection   fraction was in the range of 55% to 60%. Wall motion was normal;   there were no regional wall motion abnormalities. Left   ventricular diastolic function parameters were normal.  Laboratory Data:  Chemistry Recent Labs  Lab 06/18/18 1541 06/19/18 1348  NA 143  --   K 4.1  --   CL 105  --   CO2 29  --   GLUCOSE 100*  --   BUN 11  --   CREATININE 0.69 0.69  CALCIUM 9.6  --   GFRNONAA >60 >60  GFRAA >60 >60  ANIONGAP 9  --     Lab Results  Component Value Date   ALT 14 06/18/2018   AST 12 (L) 06/18/2018   ALKPHOS 35 (L) 06/18/2018   BILITOT 0.9 06/18/2018   Hematology Recent Labs  Lab 06/18/18 1541 06/19/18 1348  WBC 6.5 4.7  RBC 4.80 4.44  HGB 14.6 13.4  HCT 43.5 40.5  MCV 90.6 91.2  MCH 30.4 30.2  MCHC 33.6 33.1  RDW 11.1* 11.1*  PLT 210 183   Cardiac Enzymes Recent Labs  Lab 06/19/18 0134 06/19/18 0734  TROPONINI <0.03 <0.03    Recent Labs  Lab 06/18/18 1539 06/18/18 2257  TROPIPOC 0.02 0.02    TSH:  Lab Results  Component Value Date   TSH 1.540 06/19/2018   Lipids: Lab Results  Component Value Date   CHOL 121 06/19/2018   HDL 32 (L) 06/19/2018   LDLCALC 74 06/19/2018   TRIG 77 06/19/2018   CHOLHDL 3.8 06/19/2018   HgbA1c: Lab Results  Component Value Date   HGBA1C 4.9 06/19/2018    Radiology/Studies:  Dg Chest 2 View  Result  Date: 06/18/2018 CLINICAL DATA:  Shortness of breath and chest tightness EXAM: CHEST - 2 VIEW COMPARISON:  None. FINDINGS: Lungs are clear. Heart size and pulmonary vascularity are normal. No adenopathy. No pneumothorax. No bone lesions. IMPRESSION: No edema or consolidation. Electronically Signed   By: Bretta Bang III M.D.   On: 06/18/2018 16:48   Ct Angio Chest Pe W/cm &/or Wo Cm  Result Date: 06/19/2018 CLINICAL DATA:  Chest tightness beginning 2 days ago. EXAM: CT ANGIOGRAPHY CHEST WITH CONTRAST TECHNIQUE: Multidetector CT imaging of the chest was performed using the standard protocol during bolus administration of intravenous contrast. Multiplanar CT image reconstructions and MIPs were obtained to evaluate the vascular anatomy. CONTRAST:  ISOVUE-370 IOPAMIDOL (ISOVUE-370) INJECTION 76% COMPARISON:  Same day CXR FINDINGS: Cardiovascular: Aberrant right subclavian artery, an anatomic variant. No acute pulmonary embolus to the segmental level. Nonaneurysmal thoracic aorta. Heart size is normal without pericardial effusion or thickening. Mediastinum/Nodes: No enlarged mediastinal, hilar, or axillary lymph nodes. Thyroid gland, trachea, and esophagus demonstrate no significant findings. Lungs/Pleura: Lungs are clear. No pleural effusion or pneumothorax. Upper Abdomen: Cholecystectomy.  No acute abnormality. Musculoskeletal: No chest wall abnormality. No acute or significant osseous findings. Review of the MIP images confirms the above findings. IMPRESSION: No acute pulmonary embolus, aortic aneurysm or dissection. Aberrant right subclavian artery. Electronically Signed   By: Tollie Eth M.D.   On: 06/19/2018 00:23   Mr Brain Wo Contrast  Result Date: 06/19/2018 CLINICAL DATA:  37 y/o F; headaches, weakness, nausea, vomiting, and dizziness for 2 weeks.  History of Hashimoto's thyroiditis. EXAM: MRI HEAD WITHOUT CONTRAST TECHNIQUE: Multiplanar, multiecho pulse sequences of the brain and surrounding  structures were obtained without intravenous contrast. COMPARISON:  None. FINDINGS: Brain: No acute infarction, hemorrhage, hydrocephalus, extra-axial collection or mass lesion. No structural or signal abnormality of the brain identified. Vascular: Normal flow voids. Skull and upper cervical spine: Normal marrow signal. Sinuses/Orbits: Negative. Other: None. IMPRESSION: Normal MRI of the brain. Electronically Signed   By: Mitzi Hansen M.D.   On: 06/19/2018 18:51    Assessment and Plan:   1.  Chest pain: - The pain that goes around her ribs and has been continuous without EKG changes or enzyme abnormalities is not felt to be cardiac.  - She did have an ECG on 9/3 that was abnormal, but subsequent ECGs were normal. - She had no coronary calcifications on cardiac CT, no wall motion abnormalities and normal EF by echo - I mentioned to her that we were trying to avoid studies with diet radiation, she is in complete agreement with this - Consider amlodipine 2.5 mg daily for possible spasm.  Because of the headaches that she has been having recently, suspect she will not tolerate Imdur. -Consider calcium score only to rule out CAD  Otherwise, per IM Active Problems:   Hypothyroidism due to Hashimoto's thyroiditis   Chest pain   Nausea & vomiting   Dizziness     For questions or updates, please contact [facility] Please consult [email or web address] for contact info under Cardiology/STEMI.   Signed, Theodore Demark, PA-C  06/20/2018 7:16 AM As above, patient seen and examined.  She feels somewhat better today.  She still has dizziness with ambulation that improves with laying down.  Her preceding symptoms included nausea, decreased appetite and generalized weakness.  She has continuous chest tightness in her rib cage that never completely resolved.  She has mild dyspnea on exertion.  Work-up to date includes echocardiogram that shows normal LV function.  CTA showed no pulmonary embolus.   Enzymes have been negative.  Electric cardiogram's have been normal with exception of one showing inferolateral T wave inversion.  I am not convinced patient has a cardiac etiology to her symptoms.  At this point I would not pursue further cardiac work-up.  I agree with hydrating for possible orthostasis.  I do not think she needs a tilt table.  We will see again as needed. [facility] will sign off.   Medication Recommendations:  No cardiac meds recommended Other recommendations (labs, testing, etc):  No further cardiac testing Follow up as an outpatient:  Primary care as outpt Olga Millers, MD

## 2018-06-20 NOTE — Consult Note (Addendum)
Neurology Consultation  Reason for Consult: Dizziness Referring Physician: Willette Pa  History is obtained from: Patient and husband  HPI: Kara Patton is a 37 y.o. female with past medical history of gluten intolerance, headache, hypothyroidism, dizziness.  Patient states that she has had a long history of panic attacks along with sensations as if her food is not going down correctly.  2 weeks ago she noted that when she would get up after about 5 minutes to 10 minutes she would start to feel dizzy, have tunnel vision, have stars in her vision, along with palm and feet sweating which will make her need to lay down to feel better.  She has never had these issues before however they seem to be getting worse.  She does not have these issues when she is sitting down.  She states that she is not in any stress at home at work or any other issues.  She denies any palpitations.  She states that she has had an echocardiogram and cardiac enzymes.  All of those have come back normal.  Patient also states that she is prone to panic attacks to which she feels chest tightness, then thinks of the worst possible things that could happen.  "If I start to think something is wrong I will think the worst thing possible such as if I have a headache I will think I have a worms  in my brain".  Patient states that she has had orthostatics while in the hospital to which she has and are negative.  She tells me that she has had an MRI which was normal-after checking records this is true.  Husband states that she has had more panic attacks since August 9 than usual.  It is unclear what has brought these on.  Husband feels as though the panic attacks has ignited these physical symptoms.  She is also had other physical symptoms such as she feels when she eats the food will not fully go down her esophagus.  She is had a full work-up for this and everything is come back negative.  I have looked through the chart and cannot find any EGD or  other diagnostic tests.  Last fainting spell occurred while she was at the grocery store.  Apparently she had gotten out of the car, was having no problems.  About 10 minutes later she told her husband she did not feel good, had tunnel vision, started to have tingling in her fingers.  And they had to put her down on the floor so that she could feel better.   ROS: A 14 point ROS was performed and is negative except as noted in the HPI.  Past Medical History:  Diagnosis Date  . Dizzy 06/2018  . Gluten intolerance   . Headache   . Hypothyroidism   . Thyroid disease    hypothyroid     Family History  Problem Relation Age of Onset  . Hypertension Mother   . Heart failure Father 53    Social History:   reports that she has never smoked. She has never used smokeless tobacco. She reports that she does not drink alcohol or use drugs.  Medications  Current Facility-Administered Medications:  .  0.9 %  sodium chloride infusion, , Intravenous, Continuous, Zannie Cove, MD, Last Rate: 50 mL/hr at 06/19/18 2219 .  acetaminophen (TYLENOL) tablet 650 mg, 650 mg, Oral, Q6H PRN, Zannie Cove, MD, 650 mg at 06/19/18 1327 .  enoxaparin (LOVENOX) injection 40 mg, 40 mg, Subcutaneous,  Q24HZannie Cove, MD, 40 mg at 06/19/18 1620 .  gi cocktail (Maalox,Lidocaine,Donnatal), 30 mL, Oral, TID PRN, Zannie Cove, MD .  levothyroxine (SYNTHROID, LEVOTHROID) tablet 125 mcg, 125 mcg, Oral, QAC breakfast, Zannie Cove, MD, 125 mcg at 06/20/18 0758 .  LORazepam (ATIVAN) injection 1 mg, 1 mg, Intravenous, PRN, Zannie Cove, MD, 1 mg at 06/19/18 1742 .  LORazepam (ATIVAN) tablet 0.5 mg, 0.5 mg, Oral, Q12H PRN, Zannie Cove, MD .  ondansetron [facility]) injection 4 mg, 4 mg, Intravenous, Q6H PRN, Zannie Cove, MD .  pantoprazole (PROTONIX) EC tablet 40 mg, 40 mg, Oral, BID, Zannie Cove, MD, 40 mg at 06/20/18 1032 .  sucralfate (CARAFATE) tablet 1 g, 1 g, Oral, TID AC & HS, Zannie Cove, MD, 1 g at 06/20/18 1610   Exam: Current vital signs: BP 112/75 (BP Location: Left Arm)   Pulse 74   Temp 97.9 F (36.6 C) (Oral)   Resp 16   Ht 5\' 5"  (1.651 m)   Wt 67.1 kg   LMP 06/07/2018   SpO2 100%   BMI 24.63 kg/m  Vital signs in last 24 hours: Temp:  [97.8 F (36.6 C)-98.2 F (36.8 C)] 97.9 F (36.6 C) (09/05 1121) Pulse Rate:  [58-74] 74 (09/05 1121) Resp:  [14-17] 16 (09/05 1121) BP: (94-116)/(63-84) 112/75 (09/05 1121) SpO2:  [96 %-100 %] 100 % (09/05 1121) Weight:  [67.1 kg] 67.1 kg (09/05 0800)  GENERAL: Awake, alert in NAD HEENT: - Normocephalic and atraumatic, dry mm, no LN++, no Thyromegally Ext: warm, well perfused, intact peripheral pulses  NEURO:  Mental Status: AA&Ox3, speech is clear.  Naming, repetition, fluency, and comprehension intact. Cranial Nerves: PERRL 2 mm/brisk. EOMI, visual fields full, no facial asymmetry, facial sensation intact, hearing intact, tongue/uvula/ Motor: 5/5 strength throughout Tone: is normal and bulk is normal Sensation- Intact to light touch bilaterally Coordination: FTN intact bilaterally, no ataxia in BLE. Gait-patient was stood up, stated she felt "static sensation in her head but no tunnel vision or blurred vision".  When asked to walk patient slightly shuffled her feet but was able to turn around with no difficulty and walk back to her bed and sit down.  Labs I have reviewed labs in epic and the results pertinent to this consultation are:   CBC    Component Value Date/Time   WBC 3.6 (L) 06/20/2018 0703   RBC 4.52 06/20/2018 0703   HGB 13.6 06/20/2018 0703   HCT 41.0 06/20/2018 0703   PLT 180 06/20/2018 0703   MCV 90.7 06/20/2018 0703   MCH 30.1 06/20/2018 0703   MCHC 33.2 06/20/2018 0703   RDW 11.3 (L) 06/20/2018 0703   LYMPHSABS 2.1 06/18/2018 1541   MONOABS 0.5 06/18/2018 1541   EOSABS 0.0 06/18/2018 1541   BASOSABS 0.0 06/18/2018 1541    CMP     Component Value Date/Time   NA 141  06/20/2018 0703   K 3.6 06/20/2018 0703   CL 106 06/20/2018 0703   CO2 27 06/20/2018 0703   GLUCOSE 96 06/20/2018 0703   BUN 7 06/20/2018 0703   CREATININE 0.68 06/20/2018 0703   CALCIUM 9.0 06/20/2018 0703   PROT 6.3 (L) 06/20/2018 0703   ALBUMIN 3.7 06/20/2018 0703   AST 12 (L) 06/20/2018 0703   ALT 11 06/20/2018 0703   ALKPHOS 29 (L) 06/20/2018 0703   BILITOT 0.7 06/20/2018 0703   GFRNONAA >60 06/20/2018 0703   GFRAA >60 06/20/2018 0703    Lipid Panel  Component Value Date/Time   CHOL 121 06/19/2018 0134   TRIG 77 06/19/2018 0134   HDL 32 (L) 06/19/2018 0134   CHOLHDL 3.8 06/19/2018 0134   VLDL 15 06/19/2018 0134   LDLCALC 74 06/19/2018 0134     Imaging I have reviewed the images obtained:   ECG showed no abnormalities  MRI examination of the brain--showed no abnormalities  Assessment: 37 year old female with 2-week history of what appears to be presyncopal episodes consisting of tunnel vision, lightheadedness.  Thus far all work-up test and diagnostics have been normal.  No evidence on exam of neuropathic process.   She may benefit from outpatient follow up with neurology for headache management.   Recommendations: - May consider tilt table test as an outpatient if cardiology agrees  Ritta Slot, MD [facility] [phone]  If 7pm- 7am, please page neurology on call as listed in AMION.

## 2018-06-20 NOTE — Progress Notes (Signed)
Pt speaking with NP about DC and appropriate follow up with PCP and Endocrinology.

## 2018-06-21 ENCOUNTER — Ambulatory Visit
Admission: RE | Admit: 2018-06-21 | Discharge: 2018-06-21 | Disposition: A | Discharge status: Home / Self Care | Payer: BLUE CROSS/BLUE SHIELD | Source: Ambulatory Visit | Attending: Internal Medicine | Admitting: Internal Medicine

## 2018-06-21 DIAGNOSIS — E039 Hypothyroidism, unspecified: Secondary | ICD-10-CM | POA: Diagnosis not present

## 2018-06-24 ENCOUNTER — Other Ambulatory Visit: Payer: Self-pay | Admitting: Gastroenterology

## 2018-06-24 DIAGNOSIS — R1310 Dysphagia, unspecified: Secondary | ICD-10-CM

## 2018-06-24 DIAGNOSIS — G4489 Other headache syndrome: Secondary | ICD-10-CM | POA: Diagnosis not present

## 2018-06-24 DIAGNOSIS — R000 Tachycardia, unspecified: Secondary | ICD-10-CM | POA: Diagnosis not present

## 2018-06-24 DIAGNOSIS — R1319 Other dysphagia: Secondary | ICD-10-CM

## 2018-06-24 DIAGNOSIS — K2960 Other gastritis without bleeding: Secondary | ICD-10-CM | POA: Diagnosis not present

## 2018-06-24 DIAGNOSIS — F419 Anxiety disorder, unspecified: Secondary | ICD-10-CM | POA: Diagnosis not present

## 2018-06-24 DIAGNOSIS — R202 Paresthesia of skin: Secondary | ICD-10-CM | POA: Diagnosis not present

## 2018-07-01 DIAGNOSIS — F411 Generalized anxiety disorder: Secondary | ICD-10-CM | POA: Diagnosis not present

## 2018-07-17 ENCOUNTER — Encounter: Payer: Self-pay | Admitting: Neurology

## 2018-07-17 ENCOUNTER — Ambulatory Visit: Payer: BLUE CROSS/BLUE SHIELD | Admitting: Neurology

## 2018-07-17 ENCOUNTER — Telehealth: Payer: Self-pay

## 2018-07-17 VITALS — BP 105/65 | HR 91 | Ht 65.0 in | Wt 152.5 lb

## 2018-07-17 DIAGNOSIS — R42 Dizziness and giddiness: Secondary | ICD-10-CM | POA: Diagnosis not present

## 2018-07-17 MED ORDER — PROPRANOLOL HCL 20 MG PO TABS: 20.0000 mg | ORAL_TABLET | Freq: Two times a day (BID) | ORAL | 3 refills | Status: DC

## 2018-07-17 NOTE — Telephone Encounter (Signed)
No problem from propranolol.

## 2018-07-17 NOTE — Telephone Encounter (Signed)
Patient was prescribed propranolol today by heart doctor and she wants to know if this medication can interfere with her thyroid medication and should she wait for a certain amount of time after taking thyroid medicine to take propranolol please advise

## 2018-07-17 NOTE — Telephone Encounter (Signed)
Notified patient of message from Dr. Gherghe, patient expressed understanding and agreement. No further questions.  

## 2018-07-17 NOTE — Patient Instructions (Signed)
We will start propranolol for the headache and dizziness.   Inderal (propranolol) is a blood pressure medication that is commonly used for migraine headaches. This is a type of beta blocker. The most common side effects include low heart rate, dizziness, fatigue, and increased depression. This medication may worsen asthma. If you believe that you are having side effects on this medication, please contact our office.

## 2018-07-17 NOTE — Progress Notes (Signed)
Reason for visit: Headache, dizziness  Referring physician: Dr. Teresa Pelton is a 37 y.o. female  History of present illness:  Ms. Dlouhy is a 37 year old right-handed white female with a history of a panic disorder.  The patient also has irritable bowel syndrome and she has symptoms of chronic gastritis.  She comes to the office today with a 35-month history of problems with dizziness.  The patient indicated that she had what she thought was a panic attack with increased heart rate, dizziness, sweating of the hands and feet, she did not otherwise feel nervous or anxious.  The patient took lorazepam for this and eventually went on Zoloft for about a week but had stomach side effects on the medication and had to stop the drug.  Since that time, the patient has had problems with feeling dizzy with sitting or standing, if she walks short distances she will have an increase in her heart rate.  She does not feel dizzy lying down.  She may at times feel presyncopal, she has not blacked out.  The patient has undergone MRI of the brain that has been completely normal.  The dizziness is described as a lightheaded sensation, she is getting frequent almost daily headaches in the back of the head with a burning sensation that goes down into the neck.  The headaches may be present when she wakes up in the morning.  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  The patient is sent to this office for an evaluation.  She may be seeing cardiology in the near future to evaluate her for POTS.  Past Medical History:  Diagnosis Date  . Anxiety   . Depression   . Dizzy 06/2018  . Gluten intolerance   . Headache   . Hypothyroidism   . Thyroid disease    hypothyroid    Past Surgical History:  Procedure Laterality Date  .  CHOLECYSTECTOMY      Family History  Problem Relation Age of Onset  . Hypertension Mother   . Heart failure Father 35    Social history:  reports that she has never smoked. She has never used smokeless tobacco. She reports that she drinks alcohol. She reports that she does not use drugs.  Medications:  Prior to Admission medications   Medication Sig Start Date End Date Taking? Authorizing Provider  Cholecalciferol (VITAMIN D3 PO) Take 2,000 Units by mouth daily.   Yes [provider]  levothyroxine (SYNTHROID, LEVOTHROID) 125 MCG tablet Take 125 mcg by mouth daily before breakfast.   Yes [provider]  loratadine (CLARITIN) 10 MG tablet Take 10 mg by mouth daily.   Yes [provider]  LORazepam (ATIVAN) 0.5 MG tablet Take 0.5 mg by mouth every 12 (twelve) hours as needed for anxiety.  05/27/18  Yes [provider]  MAGNESIUM CITRATE PO Take 250 mg by mouth daily.   Yes [provider]  Misc Natural Products (OSTEO BI-FLEX JOINT SHIELD) TABS Take 1 tablet by mouth daily. OSTEO BI-FLEX   Yes [provider]  Omega-3 Fatty Acids (FISH OIL PO) Take 1,000 mg by mouth daily.   Yes [provider]  ondansetron (ZOFRAN-ODT) 8 MG disintegrating tablet Take 8 mg by mouth every 8 (eight) hours as needed for nausea or vomiting.  Yes [provider]  pantoprazole (PROTONIX) 40 MG tablet Take 40 mg by mouth 2 (two) times daily.   Yes [provider]  sucralfate (CARAFATE) 1 g tablet Take 1 g by mouth 2 (two) times daily.    Yes [provider]      Allergies  Allergen Reactions  . Advil [Ibuprofen] Other (See Comments)    Kidney dysfunction  . Gluten Meal   . Penicillins Hives    And swelling    ROS:  Out of a complete 14 system review of symptoms, the patient complains only of the following symptoms, and all other reviewed systems are negative.  Chills, fatigue Chest pain, palpitations of the  heart Ringing in the ears, difficulty swallowing Itching Blurred vision Shortness of breath Diarrhea, constipation Easy bruising Feeling cold, flushing Joint pain, aching muscles Allergies, skin sensitivity Headache, numbness, difficulty swallowing, dizziness, tremor Depression, anxiety, decreased energy, change in appetite  Blood pressure 105/65, pulse 91, height 5\' 5"  (1.651 m), weight 152 lb 8 oz (69.2 kg), SpO2 97 %.   Blood pressure, right arm, sitting is 118/80 heart rate of 76.  Blood pressure, right arm, standing is 120 systolic, heart rate 76.  Physical Exam  General: The patient is alert and cooperative at the time of the examination.  Eyes: Pupils are equal, round, and reactive to light. Discs are flat bilaterally.  Neck: The neck is supple, no carotid bruits are noted.  Respiratory: The respiratory examination is clear.  Cardiovascular: The cardiovascular examination reveals a regular rate and rhythm, no obvious murmurs or rubs are noted.  Skin: Extremities are without significant edema.  Neurologic Exam  Mental status: The patient is alert and oriented x 3 at the time of the examination. The patient has apparent normal recent and remote memory, with an apparently normal attention span and concentration ability.  Cranial nerves: Facial symmetry is present. There is good sensation of the face to pinprick and soft touch bilaterally. The strength of the facial muscles and the muscles to head turning and shoulder shrug are normal bilaterally. Speech is well enunciated, no aphasia or dysarthria is noted. Extraocular movements are full. Visual fields are full. The tongue is midline, and the patient has symmetric elevation of the soft palate. No obvious hearing deficits are noted.  Motor: The motor testing reveals 5 over 5 strength of all 4 extremities. Good symmetric motor tone is noted throughout.  Sensory: Sensory testing is intact to pinprick, soft touch, vibration  sensation, and position sense on all 4 extremities. No evidence of extinction is noted.  Coordination: Cerebellar testing reveals good finger-nose-finger and heel-to-shin bilaterally.  Gait and station: Gait is normal. Tandem gait is normal. Romberg is negative. No drift is seen.  Reflexes: Deep tendon reflexes are symmetric and normal bilaterally. Toes are downgoing bilaterally.   MRI brain 06/19/18:  IMPRESSION: Normal MRI of the brain.  * MRI scan images were reviewed online. I agree with the written report.    Assessment/Plan:  1.  Episodic dizziness, postural  2.  Frequent posterior headache  3.  Panic disorder  4.  Irritable bowel syndrome  The patient appears to have multiple somatic complaints, she likely has a significant degree of underlying anxiety that may underlie some of her somatic complaints.  There is a question of possible POTS.  The patient does appear to have some elevation in heart rate at times, this is a possibility.  The patient will be placed on propranolol for her headaches and  hopefully this may also help her anxiety and improve some of her somatic symptoms.  The patient will follow-up in about 3 months, she will call for any dose adjustments of her medication.   Marlan Palau MD 07/17/2018 9:13 AM  [name] [address] [place], [place] [address]  Phone [phone] Fax [phone]

## 2018-07-17 NOTE — Telephone Encounter (Signed)
Please advise 

## 2018-07-19 ENCOUNTER — Ambulatory Visit: Payer: BLUE CROSS/BLUE SHIELD | Admitting: Internal Medicine

## 2018-07-23 ENCOUNTER — Telehealth: Payer: Self-pay | Admitting: Neurology

## 2018-07-23 NOTE — Telephone Encounter (Signed)
I called the patient.  The patient took 10 mg of Inderal, had diarrhea and chest pain from this.  These are unusual side effects from the medication, but the patient indicates that most medications cause stomach upset for her.  The patient will try taking just 10 mg daily for the next 5 days and then try to ease into taking 10 mg twice daily.  If she cannot tolerate the drug, we may try 25 mg Toprol XL tablet.

## 2018-07-23 NOTE — Telephone Encounter (Signed)
Spoke with pt. who sts. she discussed Inderal with her pcp and was advised to start at 10mg  instead of 20, so she is not taking a whole dose yet. She would like to know if you feel side effects will lessen or if you would like her to stop the Inderal/fim

## 2018-07-23 NOTE — Telephone Encounter (Signed)
Pt said she is experiencing chest pain, extreme sleepiness and diarrhea since startingpropranolol (INDERAL) 20 MG tablet yesterday. She spoke with PCP and advised her to take only 10mg  which she did. She is wanting to know when should this level out?? Please call to advise

## 2018-07-24 ENCOUNTER — Other Ambulatory Visit: Payer: BLUE CROSS/BLUE SHIELD

## 2018-07-24 DIAGNOSIS — F53 Postpartum depression: Secondary | ICD-10-CM | POA: Diagnosis not present

## 2018-07-25 ENCOUNTER — Other Ambulatory Visit (INDEPENDENT_AMBULATORY_CARE_PROVIDER_SITE_OTHER): Payer: BLUE CROSS/BLUE SHIELD

## 2018-07-25 ENCOUNTER — Other Ambulatory Visit: Payer: BLUE CROSS/BLUE SHIELD

## 2018-07-25 DIAGNOSIS — E063 Autoimmune thyroiditis: Secondary | ICD-10-CM

## 2018-07-25 DIAGNOSIS — E038 Other specified hypothyroidism: Secondary | ICD-10-CM | POA: Diagnosis not present

## 2018-07-25 LAB — TSH: TSH: 1.29 u[IU]/mL (ref 0.35–4.50)

## 2018-07-25 LAB — T4, FREE: Free T4: 0.97 ng/dL (ref 0.60–1.60)

## 2018-07-25 LAB — T3, FREE: T3, Free: 3 pg/mL (ref 2.3–4.2)

## 2018-07-26 ENCOUNTER — Other Ambulatory Visit: Payer: Self-pay

## 2018-07-26 ENCOUNTER — Telehealth: Payer: Self-pay | Admitting: Neurology

## 2018-07-26 ENCOUNTER — Emergency Department (HOSPITAL_COMMUNITY)
Admission: EM | Admit: 2018-07-26 | Discharge: 2018-07-26 | Disposition: A | Discharge status: Home / Self Care | Payer: BLUE CROSS/BLUE SHIELD | Attending: Emergency Medicine | Admitting: Emergency Medicine

## 2018-07-26 ENCOUNTER — Encounter (HOSPITAL_COMMUNITY): Payer: Self-pay

## 2018-07-26 DIAGNOSIS — E039 Hypothyroidism, unspecified: Secondary | ICD-10-CM | POA: Diagnosis not present

## 2018-07-26 DIAGNOSIS — R55 Syncope and collapse: Secondary | ICD-10-CM | POA: Diagnosis not present

## 2018-07-26 DIAGNOSIS — R079 Chest pain, unspecified: Secondary | ICD-10-CM | POA: Diagnosis not present

## 2018-07-26 DIAGNOSIS — Z79899 Other long term (current) drug therapy: Secondary | ICD-10-CM | POA: Diagnosis not present

## 2018-07-26 DIAGNOSIS — R42 Dizziness and giddiness: Secondary | ICD-10-CM | POA: Diagnosis not present

## 2018-07-26 LAB — CBC
HEMATOCRIT: 40.2 % (ref 36.0–46.0)
Hemoglobin: 13.1 g/dL (ref 12.0–15.0)
MCH: 30.1 pg (ref 26.0–34.0)
MCHC: 32.6 g/dL (ref 30.0–36.0)
MCV: 92.4 fL (ref 80.0–100.0)
PLATELETS: 243 10*3/uL (ref 150–400)
RBC: 4.35 MIL/uL (ref 3.87–5.11)
RDW: 11.7 % (ref 11.5–15.5)
WBC: 6.3 10*3/uL (ref 4.0–10.5)
nRBC: 0 % (ref 0.0–0.2)

## 2018-07-26 LAB — BASIC METABOLIC PANEL
Anion gap: 9 (ref 5–15)
BUN: 14 mg/dL (ref 6–20)
CALCIUM: 9 mg/dL (ref 8.9–10.3)
CO2: 23 mmol/L (ref 22–32)
CREATININE: 0.6 mg/dL (ref 0.44–1.00)
Chloride: 106 mmol/L (ref 98–111)
GFR calc Af Amer: >60 mL/min (ref >60)
GFR calc non Af Amer: >60 mL/min (ref >60)
Glucose, Bld: 97 mg/dL (ref 70–99)
Potassium: 3.6 mmol/L (ref 3.5–5.1)
Sodium: 138 mmol/L (ref 135–145)

## 2018-07-26 LAB — I-STAT BETA HCG BLOOD, ED (MC, WL, AP ONLY): I-stat hCG, quantitative: <5 m[IU]/mL (ref <5)

## 2018-07-26 LAB — URINALYSIS, ROUTINE W REFLEX MICROSCOPIC
BILIRUBIN URINE: NEGATIVE
GLUCOSE, UA: NEGATIVE mg/dL
HGB URINE DIPSTICK: NEGATIVE
KETONES UR: 5 mg/dL — AB
Leukocytes, UA: NEGATIVE
Nitrite: NEGATIVE
Protein, ur: NEGATIVE mg/dL
Specific Gravity, Urine: 1.008 (ref 1.005–1.030)
pH: 6 (ref 5.0–8.0)

## 2018-07-26 LAB — CBG MONITORING, ED: Glucose-Capillary: 83 mg/dL (ref 70–99)

## 2018-07-26 MED ORDER — PROCHLORPERAZINE EDISYLATE 10 MG/2ML IJ SOLN
5.0000 mg | Freq: Once | INTRAMUSCULAR | Status: AC
  Administered 2018-07-26: 5 mg via INTRAVENOUS
  Filled 2018-07-26: qty 2

## 2018-07-26 MED ORDER — KETOROLAC TROMETHAMINE 30 MG/ML IJ SOLN
15.0000 mg | Freq: Once | INTRAMUSCULAR | Status: AC
  Administered 2018-07-26: 15 mg via INTRAVENOUS
  Filled 2018-07-26: qty 1

## 2018-07-26 MED ORDER — PANTOPRAZOLE SODIUM 40 MG PO TBEC: 40.0000 mg | DELAYED_RELEASE_TABLET | Freq: Two times a day (BID) | ORAL | 3 refills | Status: DC

## 2018-07-26 MED ORDER — SODIUM CHLORIDE 0.9 % IV BOLUS
1000.0000 mL | Freq: Once | INTRAVENOUS | Status: AC
  Administered 2018-07-26: 1000 mL via INTRAVENOUS

## 2018-07-26 NOTE — ED Notes (Signed)
Pt came back to this RN stating she was dizzy again.  Pt seated in chair.  Will monitor

## 2018-07-26 NOTE — Telephone Encounter (Signed)
I called the patient.  The patient went to the emergency room recently with near syncope, dizziness, nausea, and chest pain and headache.  The patient was given Dramamine to take, is okay to take this with the propranolol, patient is to stay on the medication for now.

## 2018-07-26 NOTE — ED Triage Notes (Signed)
Pt here for evaluation of dizziness and a headache for the last week. Been sick on an off for last 2 months and been seen in ED couple times for such.  A&Ox4, afebrile, not tachycardic.

## 2018-07-26 NOTE — ED Provider Notes (Addendum)
[facility] EMERGENCY DEPARTMENT Provider Note   CSN: [id number] Arrival date & time: 07/26/18  0059     History   Chief Complaint Chief Complaint  Patient presents with  . Dizziness  . Headache    HPI Kara Patton is a 37 y.o. female.  Patient presents to the ER for evaluation of headache, dizziness and near syncope.  Patient has been experiencing ongoing episodes of similar spells for 2 months.  She gets episodes of tunnel vision and feeling like she is going to pass out, often with changes in position.  She had an episode tonight which was particularly severe.  This is accompanied by chest pain.  She reports that she has had constant chest pain over the 2 months.     Past Medical History:  Diagnosis Date  . Anxiety   . Depression   . Dizzy 06/2018  . Gluten intolerance   . Headache   . Hypothyroidism   . Thyroid disease    hypothyroid    Patient Active Problem List   Diagnosis Date Noted  . Chest pain 06/19/2018  . Nausea & vomiting 06/19/2018  . Dizziness 06/19/2018  . Hypothyroidism due to Hashimoto's thyroiditis 06/06/2018  . Multiple thyroid nodules 06/06/2018    Past Surgical History:  Procedure Laterality Date  . CHOLECYSTECTOMY       OB History   None      Home Medications    Prior to Admission medications   Medication Sig Start Date End Date Taking? Authorizing Provider  Cholecalciferol (VITAMIN D3 PO) Take 2,000 Units by mouth daily.    [provider]  levothyroxine (SYNTHROID, LEVOTHROID) 125 MCG tablet Take 125 mcg by mouth daily before breakfast.    [provider]  loratadine (CLARITIN) 10 MG tablet Take 10 mg by mouth daily.    [provider]  LORazepam (ATIVAN) 0.5 MG tablet Take 0.5 mg by mouth every 12 (twelve) hours as needed for anxiety.  05/27/18   [provider]  MAGNESIUM CITRATE PO Take 250 mg by mouth daily.    [provider]  Misc Natural Products (OSTEO  BI-FLEX JOINT SHIELD) TABS Take 1 tablet by mouth daily. OSTEO BI-FLEX    [provider]  Omega-3 Fatty Acids (FISH OIL PO) Take 1,000 mg by mouth daily.    [provider]  ondansetron (ZOFRAN-ODT) 8 MG disintegrating tablet Take 8 mg by mouth every 8 (eight) hours as needed for nausea or vomiting.    [provider]  pantoprazole (PROTONIX) 40 MG tablet Take 1 tablet (40 mg total) by mouth 2 (two) times daily before a meal. 07/26/18   Shameika Speelman, Canary Brim, MD  propranolol (INDERAL) 20 MG tablet Take 1 tablet (20 mg total) by mouth 2 (two) times daily. 07/17/18   York Spaniel, MD  sucralfate (CARAFATE) 1 g tablet Take 1 g by mouth 2 (two) times daily.     [provider]    Family History Family History  Problem Relation Age of Onset  . Hypertension Mother   . Heart failure Father 74  . Bipolar disorder Sister     Social History Social History   Tobacco Use  . Smoking status: Never Smoker  . Smokeless tobacco: Never Used  Substance Use Topics  . Alcohol use: Yes    Frequency: Never    Comment: 1-2 per month  . Drug use: Never     Allergies   Advil [ibuprofen]; Gluten meal; and Penicillins  Review of Systems Review of Systems  Cardiovascular: Positive for chest pain.  Neurological: Positive for dizziness.  All other systems reviewed and are negative.    Physical Exam Updated Vital Signs BP 112/62   Pulse (!) 107   Temp 99.2 F (37.3 C)   Resp 19   SpO2 98%   Physical Exam  Constitutional: She is oriented to person, place, and time. She appears well-developed and well-nourished. No distress.  HENT:  Head: Normocephalic and atraumatic.  Right Ear: Hearing normal.  Left Ear: Hearing normal.  Nose: Nose normal.  Mouth/Throat: Oropharynx is clear and moist and mucous membranes are normal.  Eyes: Pupils are equal, round, and reactive to light. Conjunctivae and EOM are normal.  Neck: Normal range of motion. Neck supple.    Cardiovascular: Regular rhythm, S1 normal and S2 normal. Exam reveals no gallop and no friction rub.  No murmur heard. Pulmonary/Chest: Effort normal and breath sounds normal. No respiratory distress. She exhibits no tenderness.  Abdominal: Soft. Normal appearance and bowel sounds are normal. There is no hepatosplenomegaly. There is no tenderness. There is no rebound, no guarding, no tenderness at McBurney's point and negative Murphy's sign. No hernia.  Musculoskeletal: Normal range of motion.  Neurological: She is alert and oriented to person, place, and time. She has normal strength. No cranial nerve deficit or sensory deficit. Coordination normal. GCS eye subscore is 4. GCS verbal subscore is 5. GCS motor subscore is 6.  Skin: Skin is warm, dry and intact. No rash noted. No cyanosis.  Psychiatric: She has a normal mood and affect. Her speech is normal and behavior is normal. Thought content normal.  Nursing note and vitals reviewed.    ED Treatments / Results  Labs (all labs ordered are listed, but only abnormal results are displayed) Labs Reviewed  URINALYSIS, ROUTINE W REFLEX MICROSCOPIC - Abnormal; Notable for the following components:      Result Value   Ketones, ur 5 (*)    All other components within normal limits  BASIC METABOLIC PANEL  CBC  CBG MONITORING, ED  I-STAT BETA HCG BLOOD, ED ([facility], [facility], [facility] ONLY)    EKG None   ED ECG REPORT   Date: 08/06/2018  Rate: 76  Rhythm: normal sinus rhythm  QRS Axis: normal  Intervals: normal  ST/T Wave abnormalities: normal  Conduction Disutrbances:none  Narrative Interpretation:   Old EKG Reviewed: none available  I have personally reviewed the EKG tracing and agree with the computerized printout as noted.   Radiology No results found.  Procedures Procedures (including critical care time)  Medications Ordered in ED Medications  sodium chloride 0.9 % bolus 1,000 mL (0 mLs Intravenous Stopped 07/26/18 0558)  ketorolac  (TORADOL) 30 MG/ML injection 15 mg (15 mg Intravenous Given 07/26/18 0533)  prochlorperazine (COMPAZINE) injection 5 mg (5 mg Intravenous Given 07/26/18 0533)     Initial Impression / Assessment and Plan / ED Course  I have reviewed the triage vital signs and the nursing notes.  Pertinent labs & imaging results that were available during my care of the patient were reviewed by me and considered in my medical decision making (see chart for details).     Patient with near syncope and chest pain.  Etiology unclear, likely multifactorial.  Patient has had extensive work-up including hospitalization.  She has had CT angiography to rule out PE in the past.  She has had an MRI of brain.  She has been seen by neurology, both in hospital and  outpatient.  She was started on propanolol by neurology in an attempt to help with her migraines, as well as her tachycardia.  She has been noted to have tachycardia intermittently, especially with standing.  She has not had any documented hypotensive episodes.  There is a question of POTS.  She has follow-up scheduled on October 31 with cardiology for scheduling of tilt table testing.  She continues to have chest pain.  This is not cardiac.  She did have an EGD performed along the way and had some element of gastritis.  She is not currently taking the Protonix.  There is a possibility that she has had some persistent problems with reflux that might be triggering some of her symptoms.  Will put her back on Protonix, twice a day.  She was counseled that she needs to stay hydrated and increase her salt intake.  She was counseled that she needs to be careful with position changes until she follows up with cardiology.  Final Clinical Impressions(s) / ED Diagnoses   Final diagnoses:  Near syncope    ED Discharge Orders         Ordered    pantoprazole (PROTONIX) 40 MG tablet  2 times daily before meals     07/26/18 0708           Gilda Crease,  MD 07/26/18 0454    Gilda Crease, MD 08/06/18 0501

## 2018-07-26 NOTE — Telephone Encounter (Signed)
Pt states she was released from the hospital this morning.  Pt has taken dramamine for her nausea and she wants to know since her heart rate is elevated to 90-100 can she also take her propranolol (INDERAL) 20 MG tablet.  Please call

## 2018-07-26 NOTE — ED Notes (Signed)
Patient verbalizes understanding of discharge instructions. Opportunity for questioning and answers were provided. Armband removed by staff, pt discharged from ED to home via POV  

## 2018-07-26 NOTE — ED Notes (Signed)
Pt complains of multiple symptoms: headache, chest pain, dizziness, and nausea. Pt states she has been seen and is following up with doctors as advised for the last 2-3 months. Pt states her symptoms have not gotten any worse but also have not gotten any better.

## 2018-07-26 NOTE — ED Notes (Addendum)
Pt states that she does not feel right and cannot pinpoint the problem. Pt appeared to be having an anxiety attack.

## 2018-07-29 ENCOUNTER — Telehealth: Payer: Self-pay | Admitting: Cardiology

## 2018-07-29 NOTE — Telephone Encounter (Signed)
Spoke with Dr. Ebbie Latus who states pt has been having c/o of dizziness, CP, and recently had an ER visit. She report pts is scheduled to see Dr. Cristal Deer but would like to see if appointment could be moved up to rule out POTS. Appointment rescheduled for 08/06/18 at 9am. Pt made aware. Records will be faxed over.

## 2018-07-29 NOTE — Telephone Encounter (Signed)
New message:     Dr. Ebbie Latus would like to speak with Dr. Cristal Deer regarding this pt before the pt's appt on 10/31.

## 2018-07-30 DIAGNOSIS — R000 Tachycardia, unspecified: Secondary | ICD-10-CM | POA: Diagnosis not present

## 2018-07-30 DIAGNOSIS — F419 Anxiety disorder, unspecified: Secondary | ICD-10-CM | POA: Diagnosis not present

## 2018-07-31 ENCOUNTER — Telehealth: Payer: Self-pay

## 2018-07-31 NOTE — Telephone Encounter (Signed)
FAXED NOTES TO [facility] 

## 2018-08-06 ENCOUNTER — Encounter: Payer: Self-pay | Admitting: Cardiology

## 2018-08-06 ENCOUNTER — Ambulatory Visit: Payer: BLUE CROSS/BLUE SHIELD | Admitting: Cardiology

## 2018-08-06 VITALS — BP 102/68 | HR 75 | Ht 65.0 in | Wt 141.4 lb

## 2018-08-06 DIAGNOSIS — R002 Palpitations: Secondary | ICD-10-CM | POA: Diagnosis not present

## 2018-08-06 DIAGNOSIS — R072 Precordial pain: Secondary | ICD-10-CM

## 2018-08-06 DIAGNOSIS — R5383 Other fatigue: Secondary | ICD-10-CM | POA: Diagnosis not present

## 2018-08-06 DIAGNOSIS — R42 Dizziness and giddiness: Secondary | ICD-10-CM

## 2018-08-06 NOTE — Progress Notes (Signed)
Cardiology Office Note:    Date:  08/09/2018   ID:  Kara Patton, DOB April 30, 1981, MRN [id number]  PCP:  Lanice Shirts, MD  Cardiologist:  Buford Dresser, MD PhD  Referring MD: Lanice Shirts, *   CC: postural tachycardia  History of Present Illness:    Kara Patton is a 37 y.o. female with a hx of hypothyroidism 2/2 Hashimotos's thyroiditis who is seen as a new consult at the request of Schoenhoff, Altamese Cabal, * for the evaluation and management of postural tachycardia.  Patient concerns: she feels unable to live her life normally. Constantly exhausted, HR rises just from walking to the 120s-130s. Chest pain is ongoing and uncomfortable. Headaches in the back of the head. No syncope, but feels exhausted and cannot perform her usual level of activity.   She was recently hospitalized for chest pain, nausea, weakness, and dizziness. She had an extensive workup at that time, including bloodwork, cosyntropin stim test, CT chest/abd/pelvise, echo, MRI brain. All within normal limits. She was seen by Dr. Stanford Breed for a cardiology consult while inpatient, and the recommendation was for no further cardiology workup. She was recommended for neurology follow up for headaches and facial parasthesias.  Tachycardia/palpitations: -Initial onset: beginning of August 2019.  -Frequency: Nearly constant for the last three months -Duration: constant -Triggers: standing/walking -Aggravating/alleviating factors: worse with exertion, better with lying down -Syncope/near syncope: near syncope (see ER visit), no full syncope -Prior cardiac history: none -Prior ECG: normal sinus rhythm -Prior workup: echo unremarkable -Prior treatment: none -Possible medication interactions: none -Caffeine: none (very sensitive to it now) -Alcohol: none -Tobacco: none -OTC supplements: vitamin D, fish oil, osteo bi flex, multivitam, magnesium citrate, claritin PRN -Comorbidities: none  -Exercise level:  prior to August, able to go all day long doing activities with her kids. Was hiking at [employer] a few months ago.  -Labs: TSH/T3/T4 normal, kidney function/electrolytes normal, CBC normal. ESR/CRP normal. Lipase normal.  -Cardiac ROS: no chest pain, no shortness of breath, no PND, no orthopnea, no LE edema.  Changes to personal history: several recent bouts of gastritis. No bug bites, recent exotic travel, viral/bacterial infections. Has gastritis history, ends up in ER about every other year for dehydration. Never like most recent set of events. Never been told that that she has H pylori.   Past Medical History:  Diagnosis Date  . Anxiety   . Depression   . Dizzy 06/2018  . Gluten intolerance   . Headache   . Hypothyroidism   . Thyroid disease    hypothyroid    Past Surgical History:  Procedure Laterality Date  . CHOLECYSTECTOMY      Current Medications: Current Outpatient Medications on File Prior to Visit  Medication Sig  . Cholecalciferol (VITAMIN D3 PO) Take 2,000 Units by mouth daily.  Marland Kitchen levothyroxine (SYNTHROID, LEVOTHROID) 125 MCG tablet Take 125 mcg by mouth daily before breakfast.  . loratadine (CLARITIN) 10 MG tablet Take 10 mg by mouth daily.  Marland Kitchen LORazepam (ATIVAN) 0.5 MG tablet Take 0.5 mg by mouth every 12 (twelve) hours as needed for anxiety.   Marland Kitchen MAGNESIUM CITRATE PO Take 250 mg by mouth daily.  . Misc Natural Products (OSTEO BI-FLEX JOINT SHIELD) TABS Take 1 tablet by mouth daily. OSTEO BI-FLEX  . Omega-3 Fatty Acids (FISH OIL PO) Take 1,000 mg by mouth daily.  . ondansetron (ZOFRAN-ODT) 8 MG disintegrating tablet Take 8 mg by mouth every 8 (eight) hours as needed for nausea or vomiting.  . pantoprazole (  PROTONIX) 40 MG tablet Take 1 tablet (40 mg total) by mouth 2 (two) times daily before a meal.   No current facility-administered medications on file prior to visit.      Allergies:   Advil [ibuprofen]; Gluten meal; and Penicillins   Social History    Socioeconomic History  . Marital status: Married    Spouse name: Not on file  . Number of children: 2  . Years of education: Graduate school  . Highest education level: Not on file  Occupational History  . 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 Financial resource strain: Not on file  . Food insecurity:    Worry: Not on file    Inability: Not on file  . Transportation needs:    Medical: Not on file    Non-medical: Not on file  Tobacco Use  . Smoking status: Never Smoker  . Smokeless tobacco: Never Used  Substance and Sexual Activity  . Alcohol use: Yes    Frequency: Never    Comment: 1-2 per month  . Drug use: Never  . Sexual activity: Yes  Lifestyle  . Physical activity:    Days per week: Not on file    Minutes per session: Not on file  . Stress: Not on file  Relationships  . Social connections:    Talks on phone: Not on file    Gets together: Not on file    Attends religious service: Not on file    Active member of club or organization: Not on file    Attends meetings of clubs or organizations: Not on file    Relationship status: Not on file  Other Topics Concern  . Not on file  Social History Narrative   Lives in [place] w/ husband and 2 kids.   Caffeine use: 1x/week or less- coffee   Right handed      Family History: The patient's family history includes Bipolar disorder in her sister; Heart failure (age of onset: 81) in her father; Hypertension in her mother.  ROS:   Please see the history of present illness.  Additional pertinent ROS:  Constitutional: Negative for chills, fever, night sweats, unintentional weight loss  HENT: Negative for ear pain and hearing loss.   Eyes: Negative for loss of vision and eye pain.  Respiratory: Negative for cough, sputum, shortness of breath, wheezing.   Cardiovascular: Positive for chest pain and rapid heart rate. Negative for PND, orthopnea, lower extremity edema and claudication.  Gastrointestinal: Negative for  melena, and hematochezia. Positive for nausea and abdominal pain. Genitourinary: Negative for dysuria and hematuria.  Musculoskeletal: Negative for falls and myalgias.  Skin: Negative for itching and rash.  Neurological: Negative for focal weakness, focal sensory changes and loss of consciousness.  Endo/Heme/Allergies: Does not bruise/bleed easily.    EKGs/Labs/Other Studies Reviewed:    The following studies were reviewed today: Echo 06/19/18 Study Conclusions  - Left ventricle: The cavity size was normal. Wall thickness was   normal. Systolic function was normal. The estimated ejection   fraction was in the range of 55% to 60%. Wall motion was normal;   there were no regional wall motion abnormalities. Left   ventricular diastolic function parameters were normal.   Reviewed PCP notes and workup/notes from recent hospitalization as well.   EKG:  EKG is ordered today.  The ekg ordered today demonstrates normal sinus rhythm.  Recent Labs: 06/20/2018: ALT 11 07/25/2018: TSH 1.29 07/26/2018: BUN 14; Creatinine, Ser 0.60; Hemoglobin 13.1; Platelets 243; Potassium  3.6; Sodium 138  Recent Lipid Panel    Component Value Date/Time   CHOL 121 06/19/2018 0134   TRIG 77 06/19/2018 0134   HDL 32 (L) 06/19/2018 0134   CHOLHDL 3.8 06/19/2018 0134   VLDL 15 06/19/2018 0134   LDLCALC 74 06/19/2018 0134    Physical Exam:    VS:  BP 102/68   Pulse 75   Ht '5\' 5"'$  (1.651 m)   Wt 141 lb 6.4 oz (64.1 kg)   SpO2 96%   BMI 23.53 kg/m     Wt Readings from Last 3 Encounters:  08/06/18 141 lb 6.4 oz (64.1 kg)  07/17/18 152 lb 8 oz (69.2 kg)  06/20/18 148 lb (67.1 kg)    Orthostatic vital signs: Lying: 102/68, HR 76 Sitting: 98/70, HR 92 Standing immediate: 88/66, HR 95 Standing prolonged: 112/74, HR 97  GEN: Well nourished, well developed in no acute distress HEENT: Normal NECK: No JVD; No carotid bruits LYMPHATICS: No lymphadenopathy CARDIAC: regular rhythm, normal S1 and S2, no  murmurs, rubs, gallops. Radial and DP pulses 2+ bilaterally. RESPIRATORY:  Clear to auscultation without rales, wheezing or rhonchi  ABDOMEN: Soft, non-tender, non-distended MUSCULOSKELETAL:  No edema; No deformity  SKIN: Warm and dry NEUROLOGIC:  Alert and oriented x 3 PSYCHIATRIC:  Normal affect   ASSESSMENT:    1. Palpitations   2. Precordial pain   3. Dizziness   4. Fatigue, unspecified type    PLAN:    1. Palpitations/tachycardia, precordial tightness, dizziness, fatigue: -she is not orthostatic by vitals (need 20 pt systolic or 10 pt diastolic with prolonged standing), nor does her HR rise by 30 points for POTS diagnosis.  -her rhythm is sinus rhythm, and during her prior hospitalization no arrhythmias were noted. -we did review POTS at length, as they had done prior research on it. Spent >40 minutes counseling specifically on the etiology, diagnosis, and symptom management of POTS. We discussed at length that she does not meet criteria for the diagnosis, and it is unlikely that a tilt table test would change management.   Given her young age, she can reach a sinus tachycardia rate of 185 bpm with exercise (220-age) and still be within guidelines. Given her recent illness and overall conditioning level, it does not surprise me that she can easily get her heart rate to the 120s. She does not have sinus tachycardia at rest, and even with rapid position change her heart rate remains <100 bpm.  In general, the following education was given: -avoid dehydration. Oral rehydration is preferred, and routine use of IV fluids is not recommended. -if tolerated, compression stocking can assist with fluid management and prevent pooling in the legs. -slow position changes are recommended -if there is a feeling of severe lightheadedness, like near to passing out, recommend lying on the floor on the back, with legs elevated up on a chair or up against the wall. -the best long term management of  deconditioning is gradual exercise conditioning. I recommend seated exercises such as bike to start, to avoid the risk of falling with lightheadedness. Exercise programs should focus on gradually increasing exercise tolerance and conditioning.   It is unclear to me what the underlying etiology of her symptoms is, but it is unlikely to be a cardiac cause. She has had an appropriate cardiac workup to this point.   Plan for follow up: 4 mos to monitor symptoms  Medication Adjustments/Labs and Tests Ordered: Current medicines are reviewed at length with the patient today.  Concerns regarding medicines are outlined above.  Orders Placed This Encounter  Procedures  . EKG 12-Lead   No orders of the defined types were placed in this encounter.   Patient Instructions  Medication Instructions:  Your Physician recommend you continue on your current medication as directed.    If you need a refill on your cardiac medications before your next appointment, please call your pharmacy.   Lab work: None   Testing/Procedures: None  Follow-Up: At [facility], you and your health needs are our priority.  As part of our continuing mission to provide you with exceptional heart care, we have created designated Provider Care Teams.  These Care Teams include your primary Cardiologist (physician) and Advanced Practice Providers (APPs -  Physician Assistants and Nurse Practitioners) who all work together to provide you with the care you need, when you need it. You will need a follow up appointment in 4 months.  Please call our office 2 months in advance to schedule this appointment.  You may see Dr. Harrell Gave or one of the following Advanced Practice Providers on your designated Care Team:   Rosaria Ferries, PA-C . Jory Sims, DNP, ANP  Any Other Special Instructions Will Be Listed Below (If Applicable).       Signed, Buford Dresser, MD PhD 08/09/2018 8:45 AM    [place]

## 2018-08-06 NOTE — Patient Instructions (Signed)
Medication Instructions:  Your Physician recommend you continue on your current medication as directed.    If you need a refill on your cardiac medications before your next appointment, please call your pharmacy.   Lab work: None   Testing/Procedures: None  Follow-Up: At [facility], you and your health needs are our priority.  As part of our continuing mission to provide you with exceptional heart care, we have created designated Provider Care Teams.  These Care Teams include your primary Cardiologist (physician) and Advanced Practice Providers (APPs -  Physician Assistants and Nurse Practitioners) who all work together to provide you with the care you need, when you need it. You will need a follow up appointment in 4 months.  Please call our office 2 months in advance to schedule this appointment.  You may see Dr. Cristal Deer or one of the following Advanced Practice Providers on your designated Care Team:   Theodore Demark, PA-C . Joni Reining, DNP, ANP  Any Other Special Instructions Will Be Listed Below (If Applicable).

## 2018-08-09 ENCOUNTER — Encounter: Payer: Self-pay | Admitting: Cardiology

## 2018-08-15 ENCOUNTER — Ambulatory Visit: Payer: BLUE CROSS/BLUE SHIELD | Admitting: Cardiology

## 2018-08-19 ENCOUNTER — Encounter

## 2018-08-19 ENCOUNTER — Ambulatory Visit: Payer: BLUE CROSS/BLUE SHIELD | Admitting: Neurology

## 2018-09-30 DIAGNOSIS — F413 Other mixed anxiety disorders: Secondary | ICD-10-CM | POA: Diagnosis not present

## 2018-09-30 DIAGNOSIS — F53 Postpartum depression: Secondary | ICD-10-CM | POA: Diagnosis not present

## 2018-09-30 DIAGNOSIS — F900 Attention-deficit hyperactivity disorder, predominantly inattentive type: Secondary | ICD-10-CM | POA: Diagnosis not present

## 2018-09-30 DIAGNOSIS — F332 Major depressive disorder, recurrent severe without psychotic features: Secondary | ICD-10-CM | POA: Diagnosis not present

## 2018-10-04 ENCOUNTER — Other Ambulatory Visit: Payer: Self-pay

## 2018-10-04 ENCOUNTER — Telehealth: Payer: Self-pay | Admitting: Internal Medicine

## 2018-10-04 MED ORDER — LEVOTHYROXINE SODIUM 125 MCG PO TABS: 125.0000 ug | ORAL_TABLET | Freq: Every day | ORAL | 2 refills | Status: DC

## 2018-10-04 NOTE — Telephone Encounter (Signed)
MEDICATION: levothyroxine (SYNTHROID, LEVOTHROID) 125 MCG tablet-ASAP  PHARMACY:  [facility] on [address]  IS THIS A 90 DAY SUPPLY : Yes  IS PATIENT OUT OF MEDICATION: Yes  IF NOT; HOW MUCH IS LEFT: None  LAST APPOINTMENT DATE: @10 /11/2017  NEXT APPOINTMENT DATE:@2 /28/2020  DO WE HAVE YOUR PERMISSION TO LEAVE A DETAILED MESSAGE:Yes  OTHER COMMENTS:  Patient is leaving state tomorrow-Pharmacy stated to patient that they sent over second request for refill today and first request for refill at the beginning of this week.   **Let patient know to contact pharmacy at the end of the day to make sure medication is ready. **  ** Please notify patient to allow 48-72 hours to process**  **Encourage patient to contact the pharmacy for refills or they can request refills through [employer]**

## 2018-10-07 ENCOUNTER — Other Ambulatory Visit: Payer: Self-pay

## 2018-10-07 MED ORDER — LEVOTHYROXINE SODIUM 125 MCG PO TABS: 125.0000 ug | ORAL_TABLET | Freq: Every day | ORAL | 2 refills | Status: DC

## 2018-10-07 NOTE — Telephone Encounter (Signed)
This has been sent

## 2018-10-18 ENCOUNTER — Ambulatory Visit: Payer: BLUE CROSS/BLUE SHIELD | Admitting: Neurology

## 2018-10-29 DIAGNOSIS — F419 Anxiety disorder, unspecified: Secondary | ICD-10-CM | POA: Diagnosis not present

## 2018-10-29 DIAGNOSIS — R002 Palpitations: Secondary | ICD-10-CM | POA: Diagnosis not present

## 2018-11-15 ENCOUNTER — Encounter: Payer: Self-pay | Admitting: Cardiology

## 2018-11-15 ENCOUNTER — Ambulatory Visit: Payer: BLUE CROSS/BLUE SHIELD | Admitting: Cardiology

## 2018-11-15 VITALS — BP 111/84 | HR 80 | Ht 65.0 in | Wt 155.6 lb

## 2018-11-15 DIAGNOSIS — R072 Precordial pain: Secondary | ICD-10-CM | POA: Diagnosis not present

## 2018-11-15 DIAGNOSIS — Z8679 Personal history of other diseases of the circulatory system: Secondary | ICD-10-CM

## 2018-11-15 NOTE — Progress Notes (Signed)
Cardiology Office Note:    Date:  11/15/2018   ID:  Kara Kungnna Hinsley, DOB 06/10/1981, MRN [id number]  PCP:  Kendrick RanchSchoenhoff, Deborah D, MD  Cardiologist:  Jodelle RedBridgette Demetrio Leighty, MD PhD  Referring MD: Kendrick RanchSchoenhoff, Deborah D, *   CC: postural tachycardia and chest pain follow up.   History of Present Illness:    Kara Patton is a 38 y.o. female with a hx of hypothyroidism 2/2 Hashimotos's thyroiditis who is seen in follow up today. Her initial consult with me was on 08/06/18.  Cardiac history: Hospitalized 06/2018 for chest pain, nausea, weakness, and dizziness. She had an extensive workup at that time, including bloodwork, cosyntropin stim test, CT chest/abd/pelvise, echo, MRI brain. All within normal limits. She was seen by Dr. Jens Somrenshaw for a cardiology consult while inpatient, and the recommendation was for no further cardiology workup. She was recommended for neurology follow up for headaches and facial parasthesias. Her initial outpatient concerns included: Constantly exhausted, HR rises just from walking to the 120s-130s. Chest pain is ongoing and uncomfortable. Headaches in the back of the head.   Today: doing well overall. Had good, restful holidays. Still having pain, mid chest, always pinpoint pain and occasional worsening deeper pain. No clear triggers, able to go to the gym without issue. Following with GI for continued management of her gastritis. Exercising at the gym, HR can elevate but comes back down. Compression stockings are helping. Discussed non-cardiac causes of chest pain today.   Past Medical History:  Diagnosis Date  . Anxiety   . Depression   . Dizzy 06/2018  . Gluten intolerance   . Headache   . Hypothyroidism   . Thyroid disease    hypothyroid    Past Surgical History:  Procedure Laterality Date  . CHOLECYSTECTOMY      Current Medications: Current Outpatient Medications on File Prior to Visit  Medication Sig  . Cholecalciferol (VITAMIN D3 PO) Take 2,000 Units by  mouth daily.  Marland Kitchen. levothyroxine (SYNTHROID, LEVOTHROID) 125 MCG tablet Take 1 tablet (125 mcg total) by mouth daily before breakfast.  . loratadine (CLARITIN) 10 MG tablet Take 10 mg by mouth daily.  Marland Kitchen. LORazepam (ATIVAN) 0.5 MG tablet Take 0.5 mg by mouth every 12 (twelve) hours as needed for anxiety.   Marland Kitchen. MAGNESIUM CITRATE PO Take 250 mg by mouth daily.  . Misc Natural Products (OSTEO BI-FLEX JOINT SHIELD) TABS Take 1 tablet by mouth daily. OSTEO BI-FLEX  . Omega-3 Fatty Acids (FISH OIL PO) Take 1,000 mg by mouth daily.  . pantoprazole (PROTONIX) 40 MG tablet Take 1 tablet (40 mg total) by mouth 2 (two) times daily before a meal.  . ondansetron (ZOFRAN-ODT) 8 MG disintegrating tablet Take 8 mg by mouth every 8 (eight) hours as needed for nausea or vomiting.   No current facility-administered medications on file prior to visit.      Allergies:   Advil [ibuprofen]; Gluten meal; and Penicillins   Social History   Socioeconomic History  . Marital status: Married    Spouse name: Not on file  . Number of children: 2  . Years of education: Graduate school  . Highest education level: Not on file  Occupational History  . 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 Financial resource strain: Not on file  . Food insecurity:    Worry: Not on file    Inability: Not on file  . Transportation needs:    Medical: Not on file    Non-medical: Not on file  Tobacco Use  .  Smoking status: Never Smoker  . Smokeless tobacco: Never Used  Substance and Sexual Activity  . Alcohol use: Yes    Frequency: Never    Comment: 1-2 per month  . Drug use: Never  . Sexual activity: Yes  Lifestyle  . Physical activity:    Days per week: Not on file    Minutes per session: Not on file  . Stress: Not on file  Relationships  . Social connections:    Talks on phone: Not on file    Gets together: Not on file    Attends religious service: Not on file    Active member of club or organization: Not on file     Attends meetings of clubs or organizations: Not on file    Relationship status: Not on file  Other Topics Concern  . Not on file  Social History Narrative   Lives in [place] w/ husband and 2 kids.   Caffeine use: 1x/week or less- coffee   Right handed      Family History: The patient's family history includes Bipolar disorder in her sister; Heart failure (age of onset: 84) in her father; Hypertension in her mother.  ROS:   Please see the history of present illness.  Additional pertinent ROS:  Constitutional: Negative for chills, fever, night sweats, unintentional weight loss  HENT: Negative for ear pain and hearing loss.   Eyes: Negative for loss of vision and eye pain.  Respiratory: Negative for cough, sputum, shortness of breath, wheezing.   Cardiovascular: Positive for continued chest pain and rapid heart rate with exercise. Negative for PND, orthopnea, lower extremity edema and claudication.  Gastrointestinal: Negative for melena, and hematochezia. Positive for nausea and abdominal pain. Genitourinary: Negative for dysuria and hematuria.  Musculoskeletal: Negative for falls and myalgias.  Skin: Negative for itching and rash.  Neurological: Negative for focal weakness, focal sensory changes and loss of consciousness.  Endo/Heme/Allergies: Does not bruise/bleed easily.    EKGs/Labs/Other Studies Reviewed:    The following studies were reviewed today: Echo 06/19/18 Study Conclusions  - Left ventricle: The cavity size was normal. Wall thickness was   normal. Systolic function was normal. The estimated ejection   fraction was in the range of 55% to 60%. Wall motion was normal;   there were no regional wall motion abnormalities. Left   ventricular diastolic function parameters were normal.   Reviewed PCP notes and workup/notes from recent hospitalization as well.   EKG:  EKG is  Personally reviewed today.  The ekg ordered 08/06/18 demonstrates normal sinus rhythm.  Recent  Labs: 06/20/2018: ALT 11 07/25/2018: TSH 1.29 07/26/2018: BUN 14; Creatinine, Ser 0.60; Hemoglobin 13.1; Platelets 243; Potassium 3.6; Sodium 138  Recent Lipid Panel    Component Value Date/Time   CHOL 121 06/19/2018 0134   TRIG 77 06/19/2018 0134   HDL 32 (L) 06/19/2018 0134   CHOLHDL 3.8 06/19/2018 0134   VLDL 15 06/19/2018 0134   LDLCALC 74 06/19/2018 0134    Physical Exam:    VS:  BP 111/84   Pulse 80   Ht 5\' 5"  (1.651 m)   Wt 155 lb 9.6 oz (70.6 kg)   BMI 25.89 kg/m     Wt Readings from Last 3 Encounters:  11/15/18 155 lb 9.6 oz (70.6 kg)  08/06/18 141 lb 6.4 oz (64.1 kg)  07/17/18 152 lb 8 oz (69.2 kg)    Orthostatic vital signs: Lying: 102/68, HR 76 Sitting: 98/70, HR 92 Standing immediate: 88/66, HR 95 Standing  prolonged: 112/74, HR 97  GEN: Well nourished, well developed in no acute distress HEENT: Normal NECK: No JVD; No carotid bruits LYMPHATICS: No lymphadenopathy CARDIAC: regular rhythm, normal S1 and S2, no murmurs, rubs, gallops. Radial and DP pulses 2+ bilaterally. RESPIRATORY:  Clear to auscultation without rales, wheezing or rhonchi  ABDOMEN: Soft, non-tender, non-distended MUSCULOSKELETAL:  No edema; No deformity  SKIN: Warm and dry NEUROLOGIC:  Alert and oriented x 3 PSYCHIATRIC:  Normal affect   ASSESSMENT:    1. Precordial pain   2. History of sinus tachycardia    PLAN:    Intermittent tachycardia, precordial tightness: -overall continues to have precordial discomfort, nearly constant, that is atypical for cardiac pain. We reviewed other causes of chest pain at length today. She is continuing her GI evaluation, but she notes a long history of pain intolerance and nerve sensitivity. If no other etiology can be found for her pain, would consider using an agent aimed at neuropathy pain to see if it help.  -again reviewed that sinus tachycardia with exercise is ok (max exercise rate of 185 bpm using 220-age) and still be within guidelines.    -she did not meet POTS guidelines, but she has benefitted from compression stockings and hydration. Would continue this conservative management.  -encouraged continued activity  Plan for follow up: 1 year or sooner PRN  TIME SPENT WITH PATIENT: 25 minutes of direct patient care. More than 50% of that time was spent on coordination of care and counseling regarding non-cardiac causes of chest pain.  Jodelle RedBridgette Ellayna Hilligoss, MD, PhD [place]  [facility]   Medication Adjustments/Labs and Tests Ordered: Current medicines are reviewed at length with the patient today.  Concerns regarding medicines are outlined above.  No orders of the defined types were placed in this encounter.  No orders of the defined types were placed in this encounter.   Patient Instructions  Medication Instructions:  Your Physician recommend you continue on your current medication as directed.    If you need a refill on your cardiac medications before your next appointment, please call your pharmacy.   Lab work: None  Testing/Procedures: None  Follow-Up: [facility], you and your health needs are our priority.  As part of our continuing mission to provide you with exceptional heart care, we have created designated Provider Care Teams.  These Care Teams include your primary Cardiologist (physician) and Advanced Practice Providers (APPs -  Physician Assistants and Nurse Practitioners) who all work together to provide you with the care you need, when you need it. You will need a follow up appointment in 1 years.  Please call our office 2 months in advance to schedule this appointment.  You may see Dr. Cristal Deerhristopher or one of the following Advanced Practice Providers on your designated Care Team:   Theodore DemarkRhonda Barrett, PA-C . Joni ReiningKathryn Lawrence, DNP, ANP      Signed, Jodelle RedBridgette Franki Stemen, MD PhD 11/15/2018 11:21 AM    [facility]

## 2018-11-15 NOTE — Patient Instructions (Addendum)

## 2018-12-11 ENCOUNTER — Ambulatory Visit: Payer: BLUE CROSS/BLUE SHIELD | Admitting: Cardiology

## 2018-12-13 ENCOUNTER — Ambulatory Visit: Payer: BLUE CROSS/BLUE SHIELD | Admitting: Internal Medicine

## 2018-12-23 IMAGING — CT CT ABD-PELV W/ CM
2 of 4 series · 17 of 46 positions shown, 19 images · IV contrast (iopamidol)
Comparison: None.

CLINICAL DATA: Right lower quadrant pain, nausea

EXAM:
CT ABDOMEN AND PELVIS WITH CONTRAST
TECHNIQUE: Multidetector CT imaging of the abdomen and pelvis was performed
using the standard protocol following bolus administration of
intravenous contrast.
CONTRAST:  100mL ZRD5LU-DSS IOPAMIDOL (ZRD5LU-DSS) INJECTION 61%

[Series 3: abd/ pelvis 5.0 i30f 2 · axial · 0.84mm/px · z∈[+651,+1076]mm · 14 of 93 slices shown, 16 images]
[im 4/93  soft-tissue]
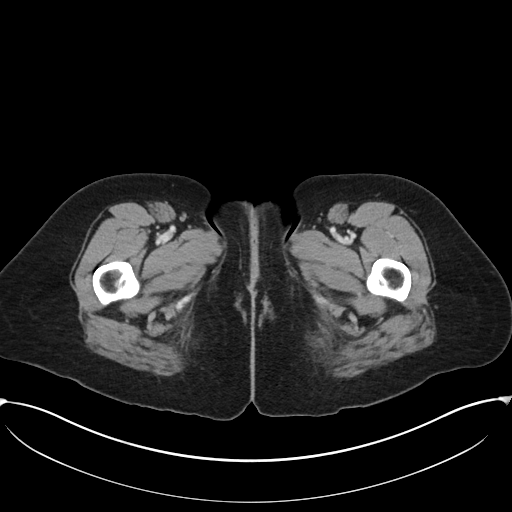
[im 4/93  bone]
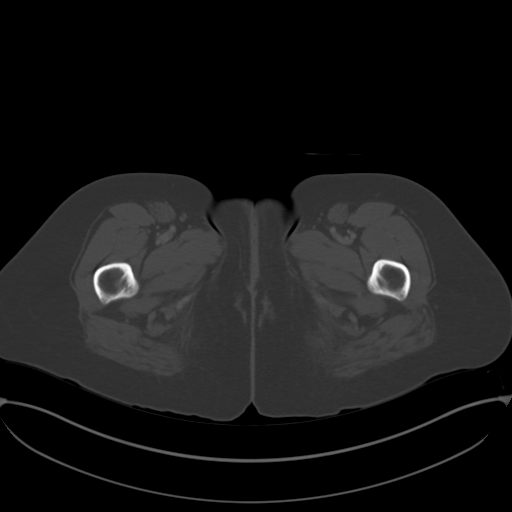
[im 12/93  soft-tissue]
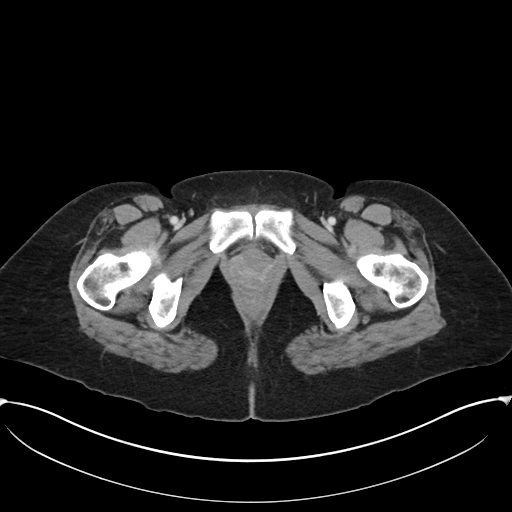
[im 20/93  soft-tissue]
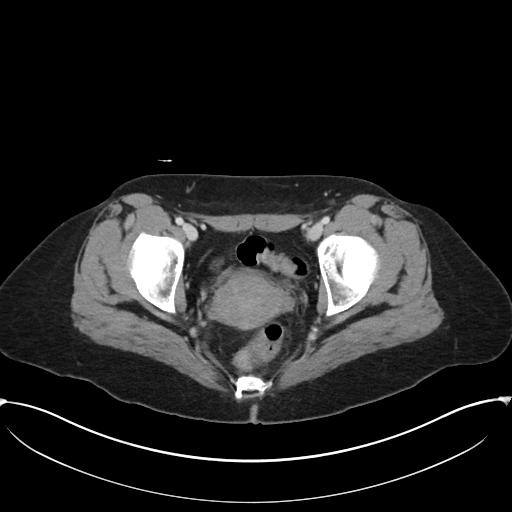
[im 24/93  soft-tissue]
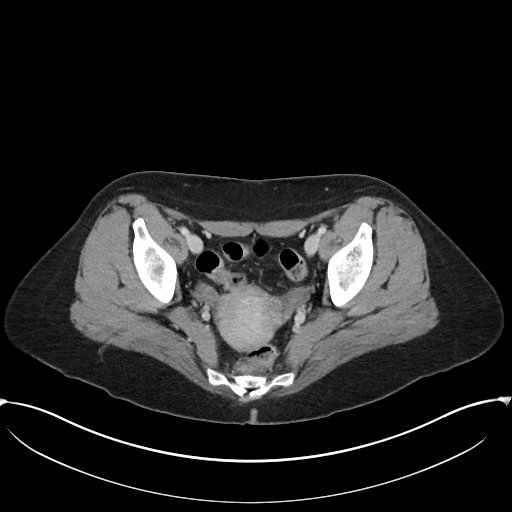
[im 31/93  soft-tissue]
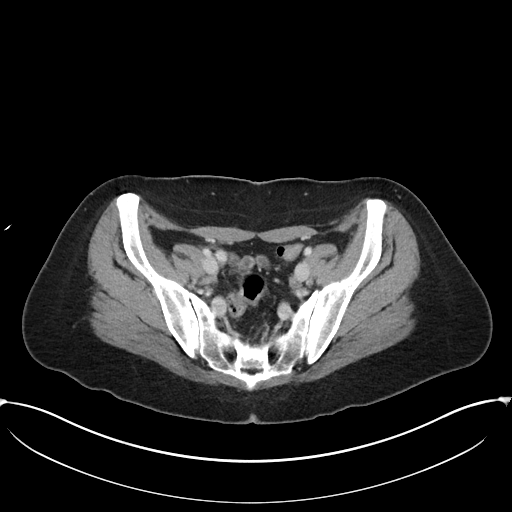
[im 39/93  soft-tissue]
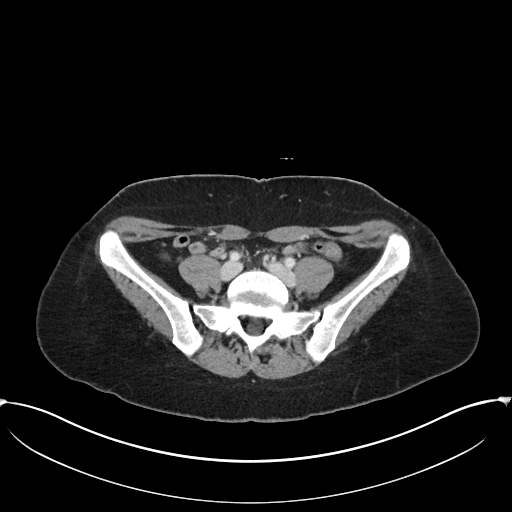
[im 43/93  soft-tissue]
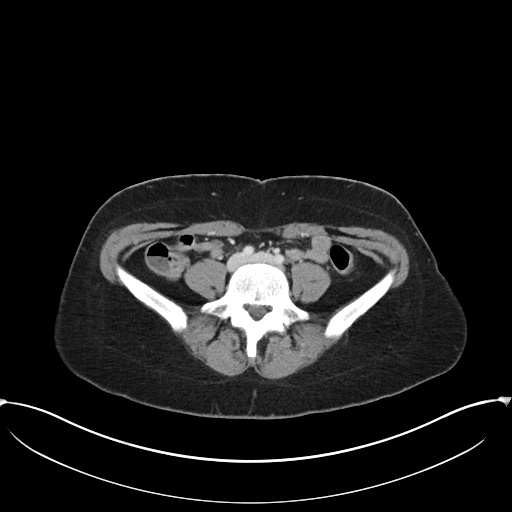
[im 50/93  soft-tissue]
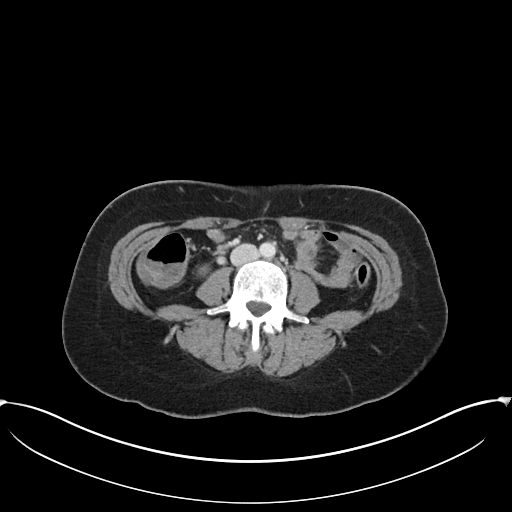
[im 54/93  soft-tissue]
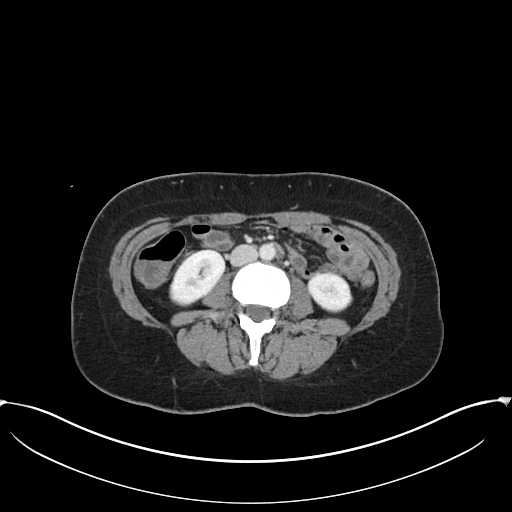
[im 54/93  bone]
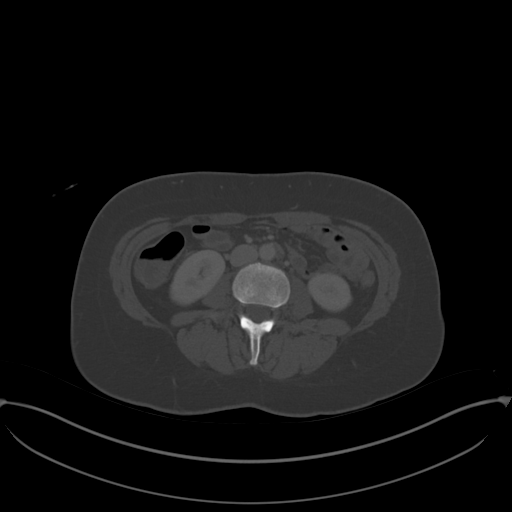
[im 62/93  soft-tissue]
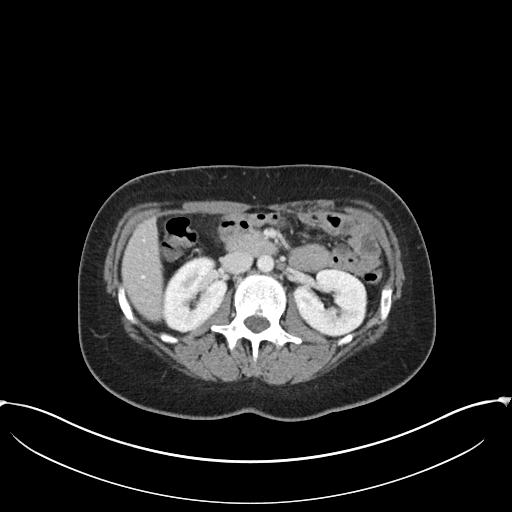
[im 70/93  soft-tissue]
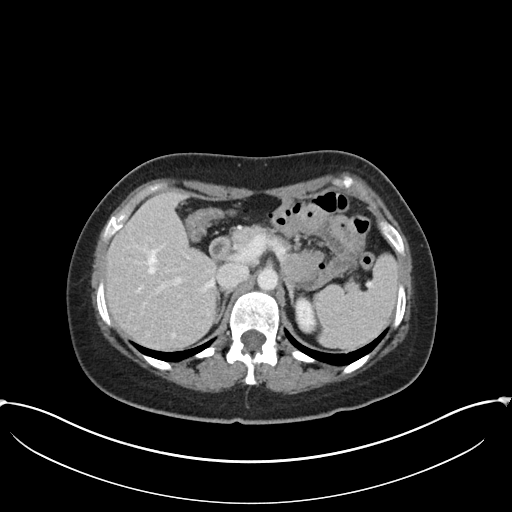
[im 73/93  soft-tissue]
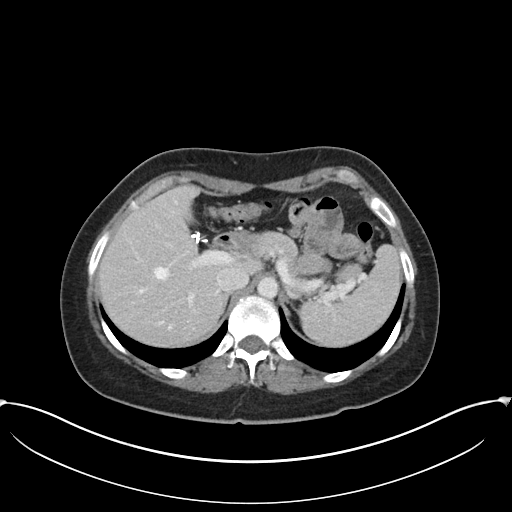
[im 81/93  soft-tissue]
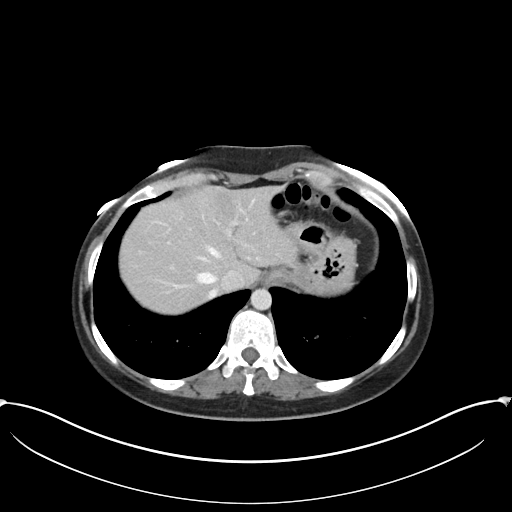
[im 89/93  soft-tissue]
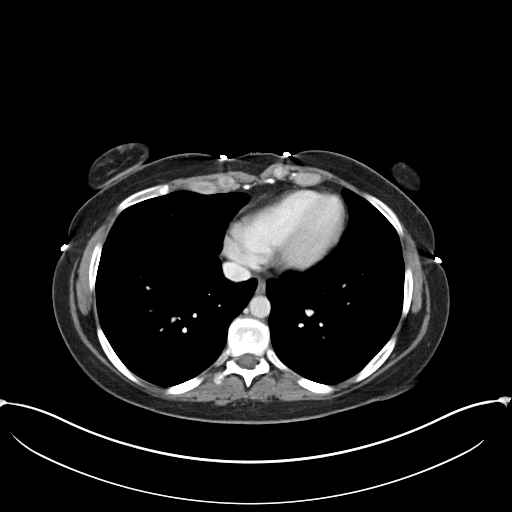

[Series 6: coronal soft tissue · coronal · 0.88mm/px · 3 of 88 slices shown]
[im 30/88  soft-tissue]
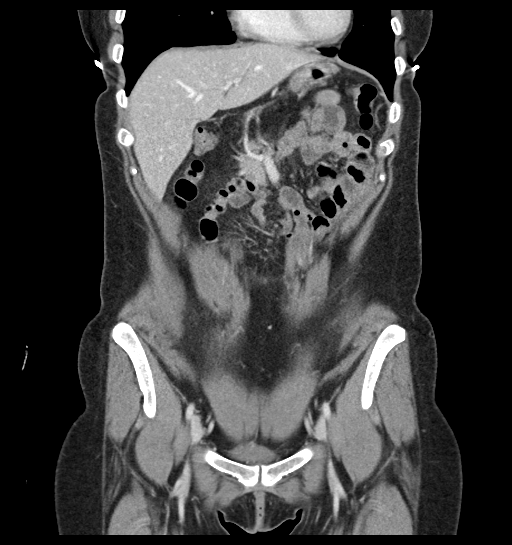
[im 39/88  soft-tissue]
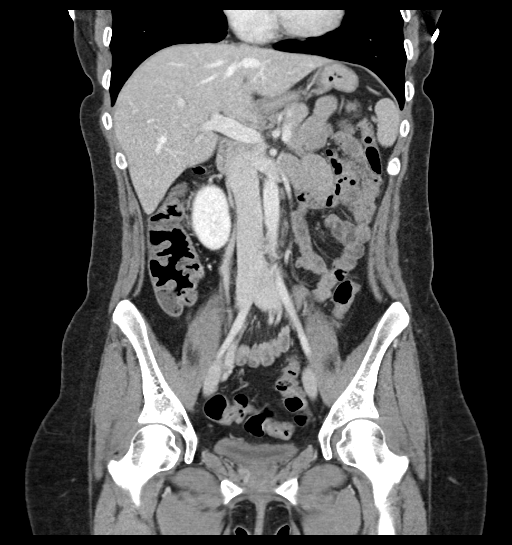
[im 49/88  soft-tissue]
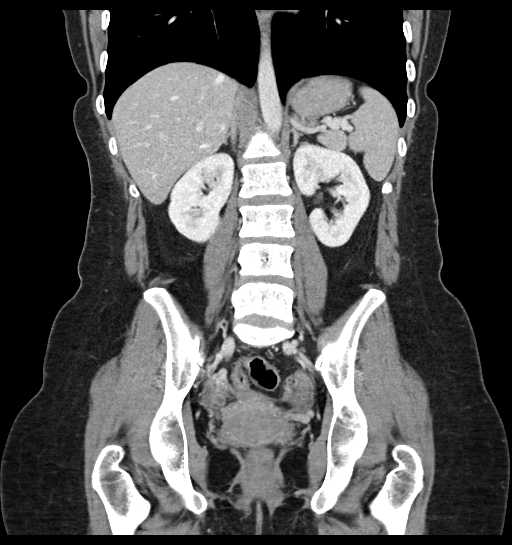

[17 of 46 positions shown; findings below may reference images not displayed]

FINDINGS: Lower chest: Lung bases are clear. No effusions. Heart is normal
size.

Hepatobiliary: No focal liver abnormality is seen. Status post
cholecystectomy. No biliary dilatation.

Pancreas: No focal abnormality or ductal dilatation.

Spleen: No focal abnormality.  Normal size.

Adrenals/Urinary Tract: No adrenal abnormality. No focal renal
abnormality. No stones or hydronephrosis. Urinary bladder is
unremarkable.

Stomach/Bowel: Normal appendix. Stomach, large and small bowel
grossly unremarkable.

Vascular/Lymphatic: No evidence of aneurysm or adenopathy.

Reproductive: Uterus and adnexa unremarkable.  No mass.

Other: No free fluid or free air.

Musculoskeletal: No acute bony abnormality or focal bone lesion.
IMPRESSION: Prior cholecystectomy.

Normal appendix.

No acute findings in the abdomen or pelvis.

## 2019-02-24 DIAGNOSIS — F411 Generalized anxiety disorder: Secondary | ICD-10-CM | POA: Diagnosis not present

## 2019-03-03 DIAGNOSIS — F411 Generalized anxiety disorder: Secondary | ICD-10-CM | POA: Diagnosis not present

## 2019-03-04 DIAGNOSIS — F411 Generalized anxiety disorder: Secondary | ICD-10-CM | POA: Diagnosis not present

## 2019-03-11 DIAGNOSIS — F411 Generalized anxiety disorder: Secondary | ICD-10-CM | POA: Diagnosis not present

## 2019-03-19 DIAGNOSIS — F411 Generalized anxiety disorder: Secondary | ICD-10-CM | POA: Diagnosis not present

## 2019-03-25 DIAGNOSIS — F411 Generalized anxiety disorder: Secondary | ICD-10-CM | POA: Diagnosis not present

## 2019-03-26 DIAGNOSIS — F411 Generalized anxiety disorder: Secondary | ICD-10-CM | POA: Diagnosis not present

## 2019-04-02 DIAGNOSIS — R1011 Right upper quadrant pain: Secondary | ICD-10-CM | POA: Diagnosis not present

## 2019-04-04 DIAGNOSIS — F411 Generalized anxiety disorder: Secondary | ICD-10-CM | POA: Diagnosis not present

## 2019-04-22 DIAGNOSIS — F411 Generalized anxiety disorder: Secondary | ICD-10-CM | POA: Diagnosis not present

## 2019-06-10 DIAGNOSIS — Z0000 Encounter for general adult medical examination without abnormal findings: Secondary | ICD-10-CM | POA: Diagnosis not present

## 2019-06-10 DIAGNOSIS — Z13220 Encounter for screening for lipoid disorders: Secondary | ICD-10-CM | POA: Diagnosis not present

## 2019-06-10 DIAGNOSIS — Z8249 Family history of ischemic heart disease and other diseases of the circulatory system: Secondary | ICD-10-CM | POA: Diagnosis not present

## 2019-06-10 DIAGNOSIS — J4520 Mild intermittent asthma, uncomplicated: Secondary | ICD-10-CM | POA: Diagnosis not present

## 2019-06-10 DIAGNOSIS — E039 Hypothyroidism, unspecified: Secondary | ICD-10-CM | POA: Diagnosis not present

## 2019-06-10 DIAGNOSIS — G43009 Migraine without aura, not intractable, without status migrainosus: Secondary | ICD-10-CM | POA: Diagnosis not present

## 2019-06-10 DIAGNOSIS — Z9189 Other specified personal risk factors, not elsewhere classified: Secondary | ICD-10-CM | POA: Diagnosis not present

## 2019-06-25 DIAGNOSIS — Z20828 Contact with and (suspected) exposure to other viral communicable diseases: Secondary | ICD-10-CM | POA: Diagnosis not present

## 2019-07-08 DIAGNOSIS — F411 Generalized anxiety disorder: Secondary | ICD-10-CM | POA: Diagnosis not present

## 2019-08-12 DIAGNOSIS — Z86018 Personal history of other benign neoplasm: Secondary | ICD-10-CM | POA: Diagnosis not present

## 2019-08-12 DIAGNOSIS — L814 Other melanin hyperpigmentation: Secondary | ICD-10-CM | POA: Diagnosis not present

## 2019-08-12 DIAGNOSIS — D485 Neoplasm of uncertain behavior of skin: Secondary | ICD-10-CM | POA: Diagnosis not present

## 2019-08-12 DIAGNOSIS — D2262 Melanocytic nevi of left upper limb, including shoulder: Secondary | ICD-10-CM | POA: Diagnosis not present

## 2019-08-19 DIAGNOSIS — E785 Hyperlipidemia, unspecified: Secondary | ICD-10-CM | POA: Diagnosis not present

## 2019-08-19 DIAGNOSIS — E039 Hypothyroidism, unspecified: Secondary | ICD-10-CM | POA: Diagnosis not present

## 2019-08-19 DIAGNOSIS — R0789 Other chest pain: Secondary | ICD-10-CM | POA: Diagnosis not present

## 2019-08-19 DIAGNOSIS — J9801 Acute bronchospasm: Secondary | ICD-10-CM | POA: Diagnosis not present

## 2019-08-19 DIAGNOSIS — J301 Allergic rhinitis due to pollen: Secondary | ICD-10-CM | POA: Diagnosis not present

## 2019-08-20 ENCOUNTER — Other Ambulatory Visit: Payer: Self-pay | Admitting: Gastroenterology

## 2019-08-20 DIAGNOSIS — R1310 Dysphagia, unspecified: Secondary | ICD-10-CM

## 2019-08-20 DIAGNOSIS — R1319 Other dysphagia: Secondary | ICD-10-CM

## 2019-08-25 DIAGNOSIS — F411 Generalized anxiety disorder: Secondary | ICD-10-CM | POA: Diagnosis not present

## 2019-09-10 DIAGNOSIS — Z20828 Contact with and (suspected) exposure to other viral communicable diseases: Secondary | ICD-10-CM | POA: Diagnosis not present

## 2019-09-15 DIAGNOSIS — L814 Other melanin hyperpigmentation: Secondary | ICD-10-CM | POA: Diagnosis not present

## 2019-09-15 DIAGNOSIS — D2271 Melanocytic nevi of right lower limb, including hip: Secondary | ICD-10-CM | POA: Diagnosis not present

## 2019-09-15 DIAGNOSIS — Z86018 Personal history of other benign neoplasm: Secondary | ICD-10-CM | POA: Diagnosis not present

## 2019-09-15 DIAGNOSIS — D485 Neoplasm of uncertain behavior of skin: Secondary | ICD-10-CM | POA: Diagnosis not present

## 2019-09-15 DIAGNOSIS — D2230 Melanocytic nevi of unspecified part of face: Secondary | ICD-10-CM | POA: Diagnosis not present

## 2019-09-15 DIAGNOSIS — D239 Other benign neoplasm of skin, unspecified: Secondary | ICD-10-CM | POA: Diagnosis not present

## 2019-10-12 DIAGNOSIS — Z20828 Contact with and (suspected) exposure to other viral communicable diseases: Secondary | ICD-10-CM | POA: Diagnosis not present

## 2019-10-12 DIAGNOSIS — Z03818 Encounter for observation for suspected exposure to other biological agents ruled out: Secondary | ICD-10-CM | POA: Diagnosis not present

## 2019-11-10 DIAGNOSIS — F411 Generalized anxiety disorder: Secondary | ICD-10-CM | POA: Diagnosis not present

## 2019-11-24 ENCOUNTER — Ambulatory Visit: Payer: BLUE CROSS/BLUE SHIELD | Admitting: Cardiology

## 2019-11-24 ENCOUNTER — Other Ambulatory Visit: Payer: Self-pay

## 2019-11-24 ENCOUNTER — Encounter: Payer: Self-pay | Admitting: Cardiology

## 2019-11-24 VITALS — BP 128/70 | HR 92 | Ht 65.0 in | Wt 176.6 lb

## 2019-11-24 DIAGNOSIS — R5383 Other fatigue: Secondary | ICD-10-CM

## 2019-11-24 DIAGNOSIS — Z8679 Personal history of other diseases of the circulatory system: Secondary | ICD-10-CM

## 2019-11-24 DIAGNOSIS — R072 Precordial pain: Secondary | ICD-10-CM

## 2019-11-24 NOTE — Progress Notes (Signed)
Cardiology Office Note:    Date:  11/24/2019   ID:  Kara Patton, DOB 12-13-1980, MRN [id number]  PCP:  Leeroy Cha, MD  Cardiologist:  Buford Dresser, MD PhD  CC: follow up  History of Present Illness:    Kara Patton is a 39 y.o. female with a hx of hypothyroidism 2/2 Hashimotos's thyroiditis who is seen in follow up today. Her initial consult with me was on 08/06/18.  Cardiac history: Hospitalized 06/2018 for chest pain, nausea, weakness, and dizziness. She had an extensive workup at that time, including bloodwork, cosyntropin stim test, CT chest/abd/pelvise, echo, MRI brain. All within normal limits. She was seen by Dr. Stanford Breed for a cardiology consult while inpatient, and the recommendation was for no further cardiology workup. She was recommended for neurology follow up for headaches and facial parasthesias. Her initial outpatient concerns included: Constantly exhausted, HR rises just from walking to the 120s-130s. Chest pain is ongoing and uncomfortable. Headaches in the back of the head.   Today: Chest pain largely unchanged, though in the last week felt a bit of a change, pain more going towards the back that usual.   Weight has gone up with the pandemic. Reviewed lipids from 06/10/19 drawn Dr. Quentin Cornwall office. Via [facility]: Tchol 233, HDL 65, LDL 154, TG 69.  Having facial flushes, noted occasional rise in her baseline BP to 113/87. Having intermittent headaches as well.   Weaned off PPI in 08/2019. Has some food sensitivity (tomatoes, vinegar, cheese, etc) now without PPI but she can tolerate most other foods.  Spent time today discussing chest pain at length. She is feeling overwhelmed, not sure what is causing her symptoms. Reviewed her cardiac workup at length.   Denies shortness of breath at rest or with normal exertion. No PND, orthopnea, LE edema or unexpected weight gain. No syncope or palpitations.  Past Medical History:  Diagnosis Date  . Anxiety   .  Depression   . Dizzy 06/2018  . Gluten intolerance   . Headache   . Hypothyroidism   . Thyroid disease    hypothyroid    Past Surgical History:  Procedure Laterality Date  . CHOLECYSTECTOMY      Current Medications: Current Outpatient Medications on File Prior to Visit  Medication Sig  . Cholecalciferol (VITAMIN D3 PO) Take 2,000 Units by mouth daily.  Marland Kitchen levothyroxine (SYNTHROID) 137 MCG tablet Take 137 mcg by mouth every morning.  . loratadine (CLARITIN) 10 MG tablet Take 10 mg by mouth daily.  Marland Kitchen LORazepam (ATIVAN) 0.5 MG tablet Take 0.5 mg by mouth every 12 (twelve) hours as needed for anxiety.   Marland Kitchen MAGNESIUM CITRATE PO Take 250 mg by mouth daily.  . Omega-3 Fatty Acids (FISH OIL PO) Take 1,000 mg by mouth daily.   No current facility-administered medications on file prior to visit.     Allergies:   Advil [ibuprofen], Gluten meal, and Penicillins   Social History   Tobacco Use  . Smoking status: Never Smoker  . Smokeless tobacco: Never Used  Substance Use Topics  . Alcohol use: Yes    Comment: 1-2 per month  . Drug use: Never    Family History: The patient's family history includes Bipolar disorder in her sister; Heart failure (age of onset: 71) in her father; Hypertension in her mother.  ROS:   Please see the history of present illness.  Additional pertinent ROS as noted in HPI.  EKGs/Labs/Other Studies Reviewed:    The following studies were reviewed today: Echo 06/19/18 -  Left ventricle: The cavity size was normal. Wall thickness was   normal. Systolic function was normal. The estimated ejection   fraction was in the range of 55% to 60%. Wall motion was normal;   there were no regional wall motion abnormalities. Left   ventricular diastolic function parameters were normal.   EKG:  EKG is  Personally reviewed today.  The ekg ordered today demonstrates normal sinus rhythm with sinus arrhythmia  Recent Labs: No results found for requested labs within last  8760 hours.  Recent Lipid Panel    Component Value Date/Time   CHOL 121 06/19/2018 0134   TRIG 77 06/19/2018 0134   HDL 32 (L) 06/19/2018 0134   CHOLHDL 3.8 06/19/2018 0134   VLDL 15 06/19/2018 0134   LDLCALC 74 06/19/2018 0134    Physical Exam:    VS:  BP 128/70   Pulse 92   Ht 5\' 5"  (1.651 m)   Wt 176 lb 9.6 oz (80.1 kg)   SpO2 99%   BMI 29.39 kg/m     Wt Readings from Last 3 Encounters:  11/24/19 176 lb 9.6 oz (80.1 kg)  11/15/18 155 lb 9.6 oz (70.6 kg)  08/06/18 141 lb 6.4 oz (64.1 kg)    GEN: Well nourished, well developed in no acute distress HEENT: Normal, moist mucous membranes NECK: No JVD CARDIAC: regular rhythm, normal S1 and S2, no rubs or gallops. No murmur. VASCULAR: Radial and DP pulses 2+ bilaterally. No carotid bruits RESPIRATORY:  Clear to auscultation without rales, wheezing or rhonchi  ABDOMEN: Soft, non-tender, non-distended MUSCULOSKELETAL:  Ambulates independently SKIN: Warm and dry, no edema NEUROLOGIC:  Alert and oriented x 3. No focal neuro deficits noted. PSYCHIATRIC:  Normal affect   ASSESSMENT:    1. History of sinus tachycardia   2. Precordial pain   3. Fatigue, unspecified type    PLAN:    Intermittent tachycardia, precordial tightness, fatigue: -she is very concerned, as she does not feel herself but has not been able to determine the etiology of her symptoms -reviewed her cardiac workup again today -no clear evidence for cardiac etiology -we discussed potential non-cardiac causes. She is being evaluated for these by PCP. Trialing change to GI treatment. With headaches, unclear if there is some other neurologic etiology involved. -counseled on red flag warning signs that need immediate medical attention. -ECG unermarkable -counseled on lifestyle/diet/exercise recommendations  We discussed her situation at length today. I offered my support, though I do not think further cardiac testing is warranted at this point. She will reach  out with any changes and I would be happy to reassess.  Plan for follow up: 1 year or sooner PRN  Total time of encounter: 35  minutes total time of encounter, including 25 minutes spent in face-to-face patient care. This time includes coordination of care and counseling regarding symptoms and cardiac workup. Remainder of non-face-to-face time involved reviewing chart documents/testing relevant to the patient encounter and documentation in the medical record.  08/08/18, MD, PhD [place]  [facility]   Medication Adjustments/Labs and Tests Ordered: Current medicines are reviewed at length with the patient today.  Concerns regarding medicines are outlined above.  Orders Placed This Encounter  Procedures  . EKG 12-Lead   No orders of the defined types were placed in this encounter.   Patient Instructions  Medication Instructions:  Your Physician recommend you continue on your current medication as directed.    *If you need a refill on your cardiac medications  before your next appointment, please call your pharmacy*  Lab Work: None  Testing/Procedures: None  Follow-Up: At [facility], you and your health needs are our priority.  As part of our continuing mission to provide you with exceptional heart care, we have created designated Provider Care Teams.  These Care Teams include your primary Cardiologist (physician) and Advanced Practice Providers (APPs -  Physician Assistants and Nurse Practitioners) who all work together to provide you with the care you need, when you need it.  Your next appointment:   1 year(s)  The format for your next appointment:   In Person  Provider:   Jodelle Red, MD  Other Instructions Consider [employer].    Signed, Jodelle Red, MD PhD 11/24/2019  [facility]

## 2019-11-24 NOTE — Patient Instructions (Addendum)
Medication Instructions:  Your Physician recommend you continue on your current medication as directed.    *If you need a refill on your cardiac medications before your next appointment, please call your pharmacy*  Lab Work: None  Testing/Procedures: None  Follow-Up: At [facility], you and your health needs are our priority.  As part of our continuing mission to provide you with exceptional heart care, we have created designated Provider Care Teams.  These Care Teams include your primary Cardiologist (physician) and Advanced Practice Providers (APPs -  Physician Assistants and Nurse Practitioners) who all work together to provide you with the care you need, when you need it.  Your next appointment:   1 year(s)  The format for your next appointment:   In Person  Provider:   Jodelle Red, MD  Other Instructions Consider [employer].

## 2019-11-26 ENCOUNTER — Encounter (HOSPITAL_COMMUNITY): Payer: Self-pay | Admitting: Emergency Medicine

## 2019-11-26 ENCOUNTER — Telehealth: Payer: Self-pay | Admitting: Neurology

## 2019-11-26 ENCOUNTER — Emergency Department (HOSPITAL_COMMUNITY)
Admission: EM | Admit: 2019-11-26 | Discharge: 2019-11-26 | Disposition: A | Discharge status: Home / Self Care | Payer: BC Managed Care – PPO | Attending: Emergency Medicine | Admitting: Emergency Medicine

## 2019-11-26 ENCOUNTER — Other Ambulatory Visit: Payer: Self-pay

## 2019-11-26 ENCOUNTER — Telehealth: Payer: Self-pay | Admitting: Cardiology

## 2019-11-26 DIAGNOSIS — R202 Paresthesia of skin: Secondary | ICD-10-CM | POA: Diagnosis not present

## 2019-11-26 DIAGNOSIS — Z79899 Other long term (current) drug therapy: Secondary | ICD-10-CM | POA: Diagnosis not present

## 2019-11-26 DIAGNOSIS — E039 Hypothyroidism, unspecified: Secondary | ICD-10-CM | POA: Insufficient documentation

## 2019-11-26 DIAGNOSIS — R0989 Other specified symptoms and signs involving the circulatory and respiratory systems: Secondary | ICD-10-CM

## 2019-11-26 DIAGNOSIS — R252 Cramp and spasm: Secondary | ICD-10-CM | POA: Diagnosis not present

## 2019-11-26 DIAGNOSIS — R519 Headache, unspecified: Secondary | ICD-10-CM | POA: Insufficient documentation

## 2019-11-26 DIAGNOSIS — R030 Elevated blood-pressure reading, without diagnosis of hypertension: Secondary | ICD-10-CM | POA: Diagnosis not present

## 2019-11-26 LAB — BASIC METABOLIC PANEL
Anion gap: 11 (ref 5–15)
BUN: 12 mg/dL (ref 6–20)
CO2: 25 mmol/L (ref 22–32)
Calcium: 8.9 mg/dL (ref 8.9–10.3)
Chloride: 103 mmol/L (ref 98–111)
Creatinine, Ser: 0.7 mg/dL (ref 0.44–1.00)
GFR calc Af Amer: >60 mL/min (ref >60)
GFR calc non Af Amer: >60 mL/min (ref >60)
Glucose, Bld: 99 mg/dL (ref 70–99)
Potassium: 4.1 mmol/L (ref 3.5–5.1)
Sodium: 139 mmol/L (ref 135–145)

## 2019-11-26 LAB — CBC WITH DIFFERENTIAL/PLATELET
Abs Immature Granulocytes: 0.03 10*3/uL (ref 0.00–0.07)
Basophils Absolute: 0 10*3/uL (ref 0.0–0.1)
Basophils Relative: 0 %
Eosinophils Absolute: 0 10*3/uL (ref 0.0–0.5)
Eosinophils Relative: 0 %
HCT: 42.7 % (ref 36.0–46.0)
Hemoglobin: 14.4 g/dL (ref 12.0–15.0)
Immature Granulocytes: 1 %
Lymphocytes Relative: 26 %
Lymphs Abs: 1.5 10*3/uL (ref 0.7–4.0)
MCH: 30.9 pg (ref 26.0–34.0)
MCHC: 33.7 g/dL (ref 30.0–36.0)
MCV: 91.6 fL (ref 80.0–100.0)
Monocytes Absolute: 0.4 10*3/uL (ref 0.1–1.0)
Monocytes Relative: 7 %
Neutro Abs: 3.8 10*3/uL (ref 1.7–7.7)
Neutrophils Relative %: 66 %
Platelets: 226 10*3/uL (ref 150–400)
RBC: 4.66 MIL/uL (ref 3.87–5.11)
RDW: 11.4 % — ABNORMAL LOW (ref 11.5–15.5)
WBC: 5.7 10*3/uL (ref 4.0–10.5)
nRBC: 0 % (ref 0.0–0.2)

## 2019-11-26 LAB — URINALYSIS, ROUTINE W REFLEX MICROSCOPIC
Bilirubin Urine: NEGATIVE
Glucose, UA: NEGATIVE mg/dL
Hgb urine dipstick: NEGATIVE
Ketones, ur: 20 mg/dL — AB
Nitrite: NEGATIVE
Protein, ur: NEGATIVE mg/dL
Specific Gravity, Urine: 1.012 (ref 1.005–1.030)
pH: 6 (ref 5.0–8.0)

## 2019-11-26 LAB — I-STAT BETA HCG BLOOD, ED (MC, WL, AP ONLY): I-stat hCG, quantitative: <5 m[IU]/mL (ref <5)

## 2019-11-26 LAB — TSH: TSH: 3.079 u[IU]/mL (ref 0.350–4.500)

## 2019-11-26 MED ORDER — DIPHENHYDRAMINE HCL 50 MG/ML IJ SOLN
12.5000 mg | Freq: Once | INTRAMUSCULAR | Status: DC
  Filled 2019-11-26: qty 1

## 2019-11-26 MED ORDER — DIPHENHYDRAMINE HCL 50 MG/ML IJ SOLN
12.5000 mg | Freq: Once | INTRAMUSCULAR | Status: AC
  Administered 2019-11-26: 12.5 mg via INTRAVENOUS
  Filled 2019-11-26: qty 1

## 2019-11-26 MED ORDER — METOCLOPRAMIDE HCL 5 MG/ML IJ SOLN
10.0000 mg | Freq: Once | INTRAMUSCULAR | Status: AC
  Administered 2019-11-26: 10 mg via INTRAVENOUS
  Filled 2019-11-26: qty 2

## 2019-11-26 MED ORDER — LORAZEPAM 2 MG/ML IJ SOLN
1.0000 mg | Freq: Once | INTRAMUSCULAR | Status: AC
  Administered 2019-11-26: 1 mg via INTRAVENOUS
  Filled 2019-11-26: qty 1

## 2019-11-26 MED ORDER — SODIUM CHLORIDE 0.9 % IV BOLUS
1000.0000 mL | Freq: Once | INTRAVENOUS | Status: AC
  Administered 2019-11-26: 1000 mL via INTRAVENOUS

## 2019-11-26 MED ORDER — DEXAMETHASONE SODIUM PHOSPHATE 10 MG/ML IJ SOLN
10.0000 mg | Freq: Once | INTRAMUSCULAR | Status: AC
  Administered 2019-11-26: 10 mg via INTRAVENOUS
  Filled 2019-11-26: qty 1

## 2019-11-26 NOTE — Telephone Encounter (Signed)
New message  Pt c/o BP issue: STAT if pt c/o blurred vision, one-sided weakness or slurred speech  1. What are your last 5 BP readings?105/83 119/99 132/117  2. Are you having any other symptoms (ex. Dizziness, headache, blurred vision, passed out)? Headache, face is numb   3. What is your BP issue?patient states that her b/p is elevated

## 2019-11-26 NOTE — ED Notes (Addendum)
Pt seen pacing around the room. Pt offered additional dose of benadryl to help her anxiety and counteract the reglan, pt refused. Says benadryl does not calm her down. MD notified

## 2019-11-26 NOTE — Telephone Encounter (Signed)
Patient was seen in the ER today for headaches, Appointment is scheduled for 01/12/20 pt is wanting to be seen sooner stated she can not wait that long. Please review and advise.

## 2019-11-26 NOTE — Telephone Encounter (Signed)
Please offer 12/18/2019 730 work in slot to the pt.

## 2019-11-26 NOTE — Telephone Encounter (Signed)
Agree with ER given symptoms.

## 2019-11-26 NOTE — ED Triage Notes (Addendum)
Pt reports h/a that won't go away for past 5 days. Pt also says she has "muscle spasms" pt checked BP at home and got 119/99 and then 130/100 an hour later. Pt saying it is very high.. Called her cardiologist (she sees for orthostatic hypotension and chronic CP) and they told her to come here. Pt took 1 dose of tylenol everyday for past 5 days with no relief

## 2019-11-26 NOTE — ED Provider Notes (Signed)
[facility] EMERGENCY DEPARTMENT Provider Note   CSN: [id number] Arrival date & time: 11/26/19  1104     History Chief Complaint  Patient presents with  . Headache    Kara Patton is a 39 y.o. female.  She has a history of hypothyroidism.  She said she has had a headache for 4 days.  Not relieved by Tylenol.  History of migraines but she said this feels more like a pulsation.  She woke up around 4 this morning and felt herself spasming and checked her blood pressure and found it to be quite elevated.  She called her cardiologist this morning and they told her she should come to the emergency department.  She is complaining of some paresthesias around her face both sides and feeling like her face is drooping both sides.  No visual symptoms.  No focal weakness in her arms or legs.  No recent illness.  No recent change in medications.  The history is provided by the patient.  Headache Pain location:  Generalized Quality:  Dull Radiates to:  Does not radiate Pain severity now: moderate. Onset quality:  Gradual Duration:  4 days Timing:  Constant Progression:  Unchanged Chronicity:  Recurrent Relieved by:  Nothing Worsened by:  Nothing Ineffective treatments:  Acetaminophen Associated symptoms: paresthesias and tingling   Associated symptoms: no abdominal pain, no blurred vision, no cough, no diarrhea, no fever, no focal weakness, no hearing loss, no neck pain, no photophobia, no sore throat, no visual change and no vomiting        Past Medical History:  Diagnosis Date  . Anxiety   . Depression   . Dizzy 06/2018  . Gluten intolerance   . Headache   . Hypothyroidism   . Thyroid disease    hypothyroid    Patient Active Problem List   Diagnosis Date Noted  . History of sinus tachycardia 11/15/2018  . Chest pain 06/19/2018  . Nausea & vomiting 06/19/2018  . Dizziness 06/19/2018  . Hypothyroidism due to Hashimoto's thyroiditis 06/06/2018  . Multiple  thyroid nodules 06/06/2018    Past Surgical History:  Procedure Laterality Date  . CHOLECYSTECTOMY       OB History   No obstetric history on file.     Family History  Problem Relation Age of Onset  . Hypertension Mother   . Heart failure Father 37  . Bipolar disorder Sister     Social History   Tobacco Use  . Smoking status: Never Smoker  . Smokeless tobacco: Never Used  Substance Use Topics  . Alcohol use: Yes    Comment: 1-2 per month  . Drug use: Never    Home Medications Prior to Admission medications   Medication Sig Start Date End Date Taking? Authorizing Provider  Cholecalciferol (VITAMIN D3 PO) Take 2,000 Units by mouth daily.    [provider]  levothyroxine (SYNTHROID) 137 MCG tablet Take 137 mcg by mouth every morning. 09/08/19   [provider]  loratadine (CLARITIN) 10 MG tablet Take 10 mg by mouth daily.    [provider]  LORazepam (ATIVAN) 0.5 MG tablet Take 0.5 mg by mouth every 12 (twelve) hours as needed for anxiety.  05/27/18   [provider]  MAGNESIUM CITRATE PO Take 250 mg by mouth daily.    [provider]  Omega-3 Fatty Acids (FISH OIL PO) Take 1,000 mg by mouth daily.    [provider]    Allergies    Advil [ibuprofen],  Gluten meal, and Penicillins  Review of Systems   Review of Systems  Constitutional: Negative for fever.  HENT: Negative for hearing loss and sore throat.   Eyes: Negative for blurred vision and photophobia.  Respiratory: Negative for cough and shortness of breath.   Cardiovascular: Negative for chest pain.  Gastrointestinal: Negative for abdominal pain, diarrhea and vomiting.  Genitourinary: Negative for dysuria.  Musculoskeletal: Negative for neck pain.  Skin: Negative for rash.  Neurological: Positive for headaches and paresthesias. Negative for focal weakness.    Physical Exam Updated Vital Signs BP 114/83 (BP Location: Right Arm)   Pulse 78   Temp  98.6 F (37 C) (Oral)   Resp 16   Ht 5\' 5"  (1.651 m)   Wt 80.1 kg   SpO2 98%   BMI 29.39 kg/m   Physical Exam Vitals and nursing note reviewed.  Constitutional:      General: She is not in acute distress.    Appearance: She is well-developed.  HENT:     Head: Normocephalic and atraumatic.  Eyes:     Conjunctiva/sclera: Conjunctivae normal.  Cardiovascular:     Rate and Rhythm: Normal rate and regular rhythm.     Heart sounds: No murmur.  Pulmonary:     Effort: Pulmonary effort is normal. No respiratory distress.     Breath sounds: Normal breath sounds.  Abdominal:     Palpations: Abdomen is soft.     Tenderness: There is no abdominal tenderness.  Musculoskeletal:     Cervical back: Neck supple.  Skin:    General: Skin is warm and dry.     Capillary Refill: Capillary refill takes less than 2 seconds.  Neurological:     Mental Status: She is alert.     GCS: GCS eye subscore is 4. GCS verbal subscore is 5. GCS motor subscore is 6.     Cranial Nerves: No cranial nerve deficit or dysarthria.     Sensory: No sensory deficit.     Motor: No weakness.     Gait: Gait normal.     Deep Tendon Reflexes: Left Babinski's sign:      ED Results / Procedures / Treatments   Labs (all labs ordered are listed, but only abnormal results are displayed) Labs Reviewed  CBC WITH DIFFERENTIAL/PLATELET - Abnormal; Notable for the following components:      Result Value   RDW 11.4 (*)    All other components within normal limits  URINALYSIS, ROUTINE W REFLEX MICROSCOPIC - Abnormal; Notable for the following components:   Ketones, ur 20 (*)    Leukocytes,Ua SMALL (*)    Bacteria, UA RARE (*)    All other components within normal limits  BASIC METABOLIC PANEL  TSH  I-STAT BETA HCG BLOOD, ED ([facility], [facility], [facility] ONLY)    EKG None  Radiology No results found.  Procedures Procedures (including critical care time)  Medications Ordered in ED Medications  diphenhydrAMINE (BENADRYL)  injection 12.5 mg (12.5 mg Intravenous Refused 11/26/19 1220)  sodium chloride 0.9 % bolus 1,000 mL (0 mLs Intravenous Stopped 11/26/19 1344)  dexamethasone (DECADRON) injection 10 mg (10 mg Intravenous Given 11/26/19 1150)  metoCLOPramide (REGLAN) injection 10 mg (10 mg Intravenous Given 11/26/19 1150)  diphenhydrAMINE (BENADRYL) injection 12.5 mg (12.5 mg Intravenous Given 11/26/19 1150)  LORazepam (ATIVAN) injection 1 mg (1 mg Intravenous Given 11/26/19 1237)    ED Course  I have reviewed the triage vital signs and the nursing notes.  Pertinent labs & imaging results  that were available during my care of the patient were reviewed by me and considered in my medical decision making (see chart for details).  Clinical Course as of Nov 25 1640  Wed Nov 26, 2019  1139 Differential diagnosis includes hypertensive urgency, tension headache, migraine headache, metabolic derangement, hyperthyroidism   [MB]  1145 Blood pressure not elevated here.  Normal neurologic exam.  Subjective paresthesias on both sides of face.   [MB]  1215 Informed by nurse that the patient is feeling very restless after getting the medication.  Have ordered her another 12.5 of Benadryl.   [MB]  1230 Patient still feeling very anxious like her heart is pounding out of her chest.  Pacing around room.  Will order some IV Ativan.   [MB]  2585 Patient states her headache and anxiety are improved.  She still feels some pressure in her head and is having intermittent twitching.  She said the symptoms of been going on over a year and nobody is been able to figure out what it is.  I offered to put in a referral to neurology for her.   [MB]    Clinical Course User Index [MB] Hayden Rasmussen, MD   MDM Rules/Calculators/A&P                       Final Clinical Impression(s) / ED Diagnoses Final diagnoses:  Bad headache  Labile blood pressure  Spasm    Rx / DC Orders ED Discharge Orders    None       Hayden Rasmussen,  MD 11/26/19 1644

## 2019-11-26 NOTE — Discharge Instructions (Signed)
You were seen in the emergency department for elevated blood pressures and a bad headache associated with some facial numbness.  You received some medication and your symptoms are improved in the emergency department.  We are placing a referral for you to neurology.  Please also contact your primary care doctor and your cardiologist regarding any other recommendations they may have for helping with your symptoms.  Return if any acute worsening or concerning symptoms.

## 2019-11-26 NOTE — Telephone Encounter (Signed)
Called pt. Stated that she was having severe headache, blurred vision and face numbness. Said her last BP numbers were 132/117 - pt stated this is very high for her. Denied all other symptoms. Stated she has not taken anything for her BP.  Recommended that she either call 911 or get someone to drive her to the ED. Stated that she needs to call office back once discharged and make an appt to see Dr. Cristal Deer.. Pt verbalized understanding.

## 2019-11-28 ENCOUNTER — Encounter: Payer: Self-pay | Admitting: Cardiology

## 2019-11-28 ENCOUNTER — Other Ambulatory Visit: Payer: Self-pay

## 2019-11-28 ENCOUNTER — Ambulatory Visit: Payer: BC Managed Care – PPO | Admitting: Cardiology

## 2019-11-28 VITALS — BP 118/84 | HR 88 | Ht 65.0 in | Wt 176.4 lb

## 2019-11-28 DIAGNOSIS — R072 Precordial pain: Secondary | ICD-10-CM

## 2019-11-28 DIAGNOSIS — G4489 Other headache syndrome: Secondary | ICD-10-CM

## 2019-11-28 DIAGNOSIS — R002 Palpitations: Secondary | ICD-10-CM

## 2019-11-28 DIAGNOSIS — R030 Elevated blood-pressure reading, without diagnosis of hypertension: Secondary | ICD-10-CM | POA: Diagnosis not present

## 2019-11-28 NOTE — Patient Instructions (Signed)

## 2019-11-28 NOTE — Progress Notes (Signed)
Cardiology Office Note:    Date:  11/28/2019   ID:  Kara Patton, DOB 07-Apr-1981, MRN [id number]  PCP:  Lorenda Ishihara, MD  Cardiologist:  Jodelle Red, MD PhD  CC: follow up  History of Present Illness:    Kara Patton is a 39 y.o. female with a hx of hypothyroidism 2/2 Hashimotos's thyroiditis who is seen in follow up today. Her initial consult with me was on 08/06/18.  Cardiac history: Hospitalized 06/2018 for chest pain, nausea, weakness, and dizziness. She had an extensive workup at that time, including bloodwork, cosyntropin stim test, CT chest/abd/pelvise, echo, MRI brain. All within normal limits. She was seen by Dr. Jens Som for a cardiology consult while inpatient, and the recommendation was for no further cardiology workup. She was recommended for neurology follow up for headaches and facial parasthesias. Her initial outpatient concerns included: Constantly exhausted, HR rises just from walking to the 120s-130s. Chest pain is ongoing and uncomfortable. Headaches in the back of the head.   Today: I reviewed her ER notes and workup from 11/26/19.  Urgent follow up post ER visit for headaches. Reviewed BP, which was normal in the ER and here today. Has follow up appt with PCP, neurology, and endocrinology.   At the time, she had facial parasthesia and also reported facial droop/drooling. We discussed that these symptoms were the reason to refer to ER for eval, not the blood pressure. She continues to have headache but some of the symptoms have improved.  Feels "like she has been hit by a bus." She feels much worse since being in the ER. Tearful today, feels overwhelmed. Feels pain throughout her entire body, neck, shoulders, elbows, etc. Excruciating pain, feels tense throughout her body. Preventing her from being able to live her life. She is worried as last time she had a normal workup and no one found anything to treat. She feels like she shakes uncontrollably, "like a  seizure without a seizure," to the point that it makes her muscles sore.   We spent significant time discussing her cardiac workup. This has been unremarkable. She acknowledges that nothing had clearly shown up on her workup, but she feels overwhelmed at her symptoms. She just wants to try something to feel better. I offered my support for this.  Denies any change in her chest pain, shortness of breath at rest or with normal exertion. No PND, orthopnea, LE edema or unexpected weight gain. No syncope, rare palpitations.  Past Medical History:  Diagnosis Date  . Anxiety   . Depression   . Dizzy 06/2018  . Gluten intolerance   . Headache   . Hypothyroidism   . Thyroid disease    hypothyroid    Past Surgical History:  Procedure Laterality Date  . CHOLECYSTECTOMY      Current Medications: Current Outpatient Medications on File Prior to Visit  Medication Sig  . Cholecalciferol (VITAMIN D3 PO) Take 2,000 Units by mouth daily.  Marland Kitchen levothyroxine (SYNTHROID) 137 MCG tablet Take 137 mcg by mouth every morning.  . loratadine (CLARITIN) 10 MG tablet Take 10 mg by mouth daily.  Marland Kitchen LORazepam (ATIVAN) 0.5 MG tablet Take 0.5 mg by mouth every 12 (twelve) hours as needed for anxiety.   Marland Kitchen MAGNESIUM CITRATE PO Take 250 mg by mouth daily.  . Omega-3 Fatty Acids (FISH OIL PO) Take 1,000 mg by mouth daily.   No current facility-administered medications on file prior to visit.     Allergies:   Advil [ibuprofen], Gluten meal, and Penicillins  Social History   Tobacco Use  . Smoking status: Never Smoker  . Smokeless tobacco: Never Used  Substance Use Topics  . Alcohol use: Yes    Comment: 1-2 per month  . Drug use: Never    Family History: The patient's family history includes Bipolar disorder in her sister; Heart failure (age of onset: 12) in her father; Hypertension in her mother.  ROS:   Please see the history of present illness.  Additional pertinent ROS as noted in  HPI.  EKGs/Labs/Other Studies Reviewed:    The following studies were reviewed today: Echo 06/19/18 - Left ventricle: The cavity size was normal. Wall thickness was   normal. Systolic function was normal. The estimated ejection   fraction was in the range of 55% to 60%. Wall motion was normal;   there were no regional wall motion abnormalities. Left   ventricular diastolic function parameters were normal.  EKG:  EKG is  Personally reviewed today.  The ekg ordered 08/06/18 demonstrates normal sinus rhythm.  Recent Labs: 11/26/2019: BUN 12; Creatinine, Ser 0.70; Hemoglobin 14.4; Platelets 226; Potassium 4.1; Sodium 139; TSH 3.079  Recent Lipid Panel    Component Value Date/Time   CHOL 121 06/19/2018 0134   TRIG 77 06/19/2018 0134   HDL 32 (L) 06/19/2018 0134   CHOLHDL 3.8 06/19/2018 0134   VLDL 15 06/19/2018 0134   LDLCALC 74 06/19/2018 0134    Physical Exam:    VS:  BP 118/84   Pulse 88   Ht 5\' 5"  (1.651 m)   Wt 176 lb 6.4 oz (80 kg)   SpO2 98%   BMI 29.35 kg/m     Wt Readings from Last 3 Encounters:  11/28/19 176 lb 6.4 oz (80 kg)  11/26/19 176 lb 9.6 oz (80.1 kg)  11/24/19 176 lb 9.6 oz (80.1 kg)    Orthostatic vital signs: Lying: 102/68, HR 76 Sitting: 98/70, HR 92 Standing immediate: 88/66, HR 95 Standing prolonged: 112/74, HR 97  GEN: Well nourished, well developed in no acute distress HEENT: Normal NECK: No JVD; No carotid bruits LYMPHATICS: No lymphadenopathy CARDIAC: regular rhythm, normal S1 and S2, no murmurs, rubs, gallops. Radial and DP pulses 2+ bilaterally. RESPIRATORY:  Clear to auscultation without rales, wheezing or rhonchi  ABDOMEN: Soft, non-tender, non-distended MUSCULOSKELETAL:  No edema; No deformity  SKIN: Warm and dry NEUROLOGIC:  Alert and oriented x 3 PSYCHIATRIC:  Normal affect   ASSESSMENT:    1. Transient elevated blood pressure   2. Other headache syndrome   3. Precordial pain   4. Palpitations    PLAN:    Intermittent  elevated blood pressure: this is normal today and in the ER. The elevation she describes on her home monitor is not at a level that I would associate with stroke like symptoms. -we discussed at length that pain and discomfort can drive blood pressure higher -at goal today  Headache, unclear etiology. Reported parasthesias of face and facial drooling/drooping at the time. -had eval in the ER for this 11/26/19 -she received steroids and felt awful. Received ativan and benadryl due to the restlessness from the steroid. -she is understandably upset that she feels terrible without a clear cause or pathway for improvement. She has pending follow up with PCP, neurology and endocrinology -I offered my support. I do not think at this time there is significant cardiac etiology of her symptoms  Chronic chest pain, as a part of entire body pain, and intermittent palpitations -this has been unchanged -normal  echo -we've discussed monitor in the past, though symptoms highly related to stress/pain events -no high risk features thus far -likely part of complex symptom patterns as noted  We will continue to follow, but I urged her to discuss whether empiric therapy for her generalized symptoms may be beneficial. It is out of my area of expertise, will defer to PCP/neurology.  Follow up in 6 mos or sooner PRN  Buford Dresser, MD, PhD [place]  [facility]   Medication Adjustments/Labs and Tests Ordered: Current medicines are reviewed at length with the patient today.  Concerns regarding medicines are outlined above.  No orders of the defined types were placed in this encounter.  No orders of the defined types were placed in this encounter.   Patient Instructions  Medication Instructions:  Your Physician recommend you continue on your current medication as directed.    *If you need a refill on your cardiac medications before your next appointment, please call your pharmacy*  Lab  Work: None  Testing/Procedures: None  Follow-Up: At [facility], you and your health needs are our priority.  As part of our continuing mission to provide you with exceptional heart care, we have created designated Provider Care Teams.  These Care Teams include your primary Cardiologist (physician) and Advanced Practice Providers (APPs -  Physician Assistants and Nurse Practitioners) who all work together to provide you with the care you need, when you need it.  Your next appointment:   6 month(s)  The format for your next appointment:   In Person  Provider:   Buford Dresser, MD       Signed, Buford Dresser, MD PhD 11/28/2019 7:23 PM    [place]

## 2019-12-01 DIAGNOSIS — R519 Headache, unspecified: Secondary | ICD-10-CM | POA: Diagnosis not present

## 2019-12-01 DIAGNOSIS — R0789 Other chest pain: Secondary | ICD-10-CM | POA: Diagnosis not present

## 2019-12-01 DIAGNOSIS — E039 Hypothyroidism, unspecified: Secondary | ICD-10-CM | POA: Diagnosis not present

## 2019-12-01 DIAGNOSIS — R202 Paresthesia of skin: Secondary | ICD-10-CM | POA: Diagnosis not present

## 2019-12-05 ENCOUNTER — Encounter: Payer: Self-pay | Admitting: Cardiology

## 2019-12-12 ENCOUNTER — Ambulatory Visit (INDEPENDENT_AMBULATORY_CARE_PROVIDER_SITE_OTHER): Payer: BC Managed Care – PPO | Admitting: Internal Medicine

## 2019-12-12 ENCOUNTER — Encounter: Payer: Self-pay | Admitting: Internal Medicine

## 2019-12-12 ENCOUNTER — Other Ambulatory Visit: Payer: Self-pay

## 2019-12-12 VITALS — BP 120/86 | HR 96 | Ht 65.0 in | Wt 176.0 lb

## 2019-12-12 DIAGNOSIS — E063 Autoimmune thyroiditis: Secondary | ICD-10-CM | POA: Diagnosis not present

## 2019-12-12 DIAGNOSIS — E042 Nontoxic multinodular goiter: Secondary | ICD-10-CM | POA: Diagnosis not present

## 2019-12-12 DIAGNOSIS — E038 Other specified hypothyroidism: Secondary | ICD-10-CM | POA: Diagnosis not present

## 2019-12-12 DIAGNOSIS — R208 Other disturbances of skin sensation: Secondary | ICD-10-CM | POA: Diagnosis not present

## 2019-12-12 NOTE — Progress Notes (Signed)
Patient ID: Kara Patton, female   DOB: 1981-07-21, 39 y.o.   MRN: [id number]   This visit occurred during the SARS-CoV-2 public health emergency.  Safety protocols were in place, including screening questions prior to the visit, additional usage of staff PPE, and extensive cleaning of exam room while observing appropriate contact time as indicated for disinfecting solutions.   HPI  Kara Patton is a 39 y.o.-year-old female-year-old female, initially referred by her PCP, Dr. Constance Goltz, returning for follow-up for Hashimoto's hypothyroidism. Moved here from [place] 2018.  Last visit 1 year and 6 months ago.  She was in the ED with increased BP, facial numbness, and headache on 11/26/2019.  At that time, a TSH was checked and this was normal.  Reviewed and addended history: She was diagnosed with hypothyroidism in mid 2000 >> at our first visit, she was on Levothyroxine 125 mcg, prev. 137 mcg for years.  Now back on 137 mcg daily.  At last visit, we discussed about starting her thyroid but we stayed with levothyroxine due to increased anxiety.  Pt is on levothyroxine 137 mcg daily, taken: - in am - fasting - at least 30 min from b'fast - no Ca, Fe, MVI, PPIs (was on this last year) - not on Biotin On vit D, Magnesium.  Reviewed her TFTs: Lab Results  Component Value Date   TSH 3.079 11/26/2019   TSH 1.29 07/25/2018   TSH 1.540 06/19/2018   FREET4 0.97 07/25/2018   T3FREE 3.0 07/25/2018  05/2019: TSH 5 reportedly 05/27/2018: TSH 0.99, free T4 1.27 (0.61-1.12), free T3 3.22 (2.5-3.9)  Antithyroid antibodies: No results found for: THGAB No components found for: TPOAB  At last visit she had no weight gain, but fatigue, cold intolerance diarrhea + constipation, hair loss, joint pains.  She also had anxiety which was more pronounced.  At this visit, she complains of tingling in the Left side of face ever since her hypotensive episode. She has an appt with neurology soon. 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  Pt denies: - feeling nodules in neck - hoarseness - choking - SOB with lying down Occasional dysphagia.  She had a thyroid U/S while she was living in [place]: positive for thyroiditis and few small nodules.  We repeated a thyroid U/S on 06/21/2018: No nodules Parenchymal Echotexture: Moderately heterogenous Isthmus: 0.3 cm thickness Right lobe: 5.6 x 1.2 x 1.7 cm Left lobe: 5.1 x 1.1 x 1.2 cm No discrete nodules are seen within the thyroid gland. IMPRESSION: 1. Mild thyromegaly with heterogenous parenchyma. No discrete nodule.  She has + FH of thyroid disorders in: nephew. No FH of thyroid cancer. No h/o radiation tx to head or neck.  No herbal supplements. No Biotin use. No recent steroids use.   She also has a history of vitamin D insufficiency and takes vitamin D supplement  ROS: Constitutional: no weight gain/no weight loss, no fatigue, no subjective hyperthermia, no subjective hypothermia Eyes: no blurry vision, no xerophthalmia ENT: no sore throat, + see HPI Cardiovascular: no CP/no SOB/no palpitations/no leg swelling Respiratory: no cough/no SOB/no wheezing Gastrointestinal: no N/no V/no D/no C/no acid reflux Musculoskeletal: no muscle aches/no joint aches Skin: no rashes, no hair loss Neurological: no tremors/no numbness/no tingling/no dizziness  I reviewed pt's medications, allergies, PMH, social hx, family hx, and changes were documented in the history of present illness. Otherwise, unchanged from my initial visit note.  PMH: -See above  Social History   Socioeconomic History  . Marital status: Married    Spouse name: Not on file  . Number of children: 2  . Years of education: Not on file  . Highest education level: Not on file  Occupational History  .  Stay-at-home mom  Social Needs  . Financial resource strain: Not on file  . Food insecurity:    Worry: Not on file     Inability: Not on file  . Transportation needs:    Medical: Not on file    Non-medical: Not on file  Tobacco Use  . Smoking status: Never Smoker  . Smokeless tobacco: Never Used  Substance and Sexual Activity  . Alcohol use:  Wine, 1-2 times a month, 1-2 drinks at the time    Frequency: Never  . Drug use: Never   Current Outpatient Medications on File Prior to Visit  Medication Sig Dispense Refill  . Cholecalciferol (VITAMIN D3 PO) Take 2,000 Units by mouth daily.    Marland Kitchen levothyroxine (SYNTHROID) 137 MCG tablet Take 137 mcg by mouth every morning.    . loratadine (CLARITIN) 10 MG tablet Take 10 mg by mouth daily.    Marland Kitchen LORazepam (ATIVAN) 0.5 MG tablet Take 0.5 mg by mouth every 12 (twelve) hours as needed for anxiety.   1  . MAGNESIUM CITRATE PO Take 250 mg by mouth daily.    . Omega-3 Fatty Acids (FISH OIL PO) Take 1,000 mg by mouth daily.     No current facility-administered medications on file prior to visit.  Levothyroxine 125 mcg daily  Allergies  Allergen Reactions  . Advil [Ibuprofen] Other (See Comments)    Kidney dysfunction  . Gluten Meal   . Penicillins Hives    And swelling    Family history:  Diabetes in mother, paternal uncles, maternal grandmother  Hypertension in mother, father, grandparents  Hyperlipidemia in mother, father, grandparents  Heart disease in father, grandparents, paternal uncle  Cancer in grandmother, cousin, father, uncle, great-grandmother  PE: There were no vitals taken for this visit. Wt Readings from Last 3 Encounters:  11/28/19 176 lb 6.4 oz (80 kg)  11/26/19 176 lb 9.6 oz (80.1 kg)  11/24/19 176 lb 9.6 oz (80.1 kg)   Constitutional: overweight, in NAD Eyes: PERRLA, EOMI, no exophthalmos ENT: moist mucous membranes, no thyromegaly, but the left side of the thyroid more prominent, no cervical lymphadenopathy Cardiovascular: RRR, No MRG Respiratory: CTA B Gastrointestinal: abdomen soft, NT, ND, BS+ Musculoskeletal: no  deformities, strength intact in all 4 Skin: moist, warm, no rashes Neurological: no tremor with outstretched hands, DTR normal in all 4  ASSESSMENT: 1. Hypothyroidism  2.  History of thyroid nodules  3.  Dysesthesia  PLAN:  1. Patient with longstanding hypothyroidism, on levothyroxine therapy.  At last visit, she appears euthyroid but had multiple complaints, including generalized fatigue despite sleeping 8 to 9 hours a night, joint pain, cold intolerance, constipation + diarrhea.  We discussed about the possibility of using a combination of free T4 and free T3 instead of only free T4 to see if this improves her symptoms.  We discussed about trying Armour, but she had more anxiety and wanted to stick with levothyroxine.  The dose was increased to 137 mcg daily by PCP after a TSH obtained in her office was high, around 5.  I do not have these records - latest thyroid labs reviewed with pt >> normal: Lab Results  Component Value Date   TSH  3.079 11/26/2019   - she continues on LT4 137 mcg daily - pt feels good on this dose. - we discussed about taking the thyroid hormone every day, with water, >30 minutes before breakfast, separated by >4 hours from acid reflux medications, calcium, iron, multivitamins. Pt. is taking it correctly. -I will see her back in a year   2.  History of thyroid nodules -Patient has a history of small thyroid nodule seen on the dorsal many years in [place].  In 2019, we repeated her thyroid ultrasound and this showed a heterogeneous thyroid, slightly enlarged, but without nodules -At last visit she described some dysphagia with meals and losing weight.  The symptoms are more likely related to anxiety and also possibly to her history of hiatal hernia. -No follow-up is needed for this  3.  Dysesthesia -This was investigated in the past by MRI, CT, with all tests being normal -She will have appointment with neurology soon -We discussed about the possibility of an  MTHFR mutation and she will discuss with PCP she can get this test done through her office  Carlus Pavlov, MD PhD [facility]

## 2019-12-12 NOTE — Patient Instructions (Signed)
Please continue Levothyroxine 137 mcg daily.  Take the thyroid hormone every day, with water, at least 30 minutes before breakfast, separated by at least 4 hours from: - acid reflux medications - calcium - iron - multivitamins  Please come back for a follow-up appointment in 1 year.

## 2019-12-18 ENCOUNTER — Encounter: Payer: Self-pay | Admitting: Neurology

## 2019-12-18 ENCOUNTER — Ambulatory Visit: Payer: BC Managed Care – PPO | Admitting: Neurology

## 2019-12-18 ENCOUNTER — Other Ambulatory Visit: Payer: Self-pay

## 2019-12-18 VITALS — BP 129/82 | HR 83 | Ht 65.0 in | Wt 174.0 lb

## 2019-12-18 DIAGNOSIS — H43393 Other vitreous opacities, bilateral: Secondary | ICD-10-CM | POA: Diagnosis not present

## 2019-12-18 DIAGNOSIS — R202 Paresthesia of skin: Secondary | ICD-10-CM

## 2019-12-18 DIAGNOSIS — F410 Panic disorder [episodic paroxysmal anxiety] without agoraphobia: Secondary | ICD-10-CM

## 2019-12-18 DIAGNOSIS — R42 Dizziness and giddiness: Secondary | ICD-10-CM

## 2019-12-18 DIAGNOSIS — R519 Headache, unspecified: Secondary | ICD-10-CM | POA: Diagnosis not present

## 2019-12-18 HISTORY — DX: Panic disorder (episodic paroxysmal anxiety): F41.0

## 2019-12-18 MED ORDER — ALPRAZOLAM 0.5 MG PO TABS: ORAL_TABLET | ORAL | 0 refills | Status: DC

## 2019-12-18 NOTE — Progress Notes (Signed)
Reason for visit: Head pressure, left-sided numbness  Referring physician: [place]  Kara Patton is a 39 y.o. female  History of present illness:  Kara Patton is a 39 year old right-handed white female with a history of Hashimoto's thyroiditis, she has a history of an anxiety disorder with intermittent panic attacks.  The patient is followed through psychiatry.  The patient went to the emergency room on 26 November 2019 with onset of a pressure sensation in the head, she also noted that the lower part of her face became numb and she started become numb on the left arm and the left leg as well.  She felt that she was drooling, her husband noted some slight slurring of the speech.  The patient noted that her blood pressure was 137/119, she was told by her cardiologist to go to the emergency room.  In the emergency room, her blood pressure was normal.  A scan of the brain was not done.  The patient was treated with Benadryl and Ativan and seemed to improve.  The patient claims that since that time she has had some decreased vision in the left eye, she still has some residual numbness on the left forehead and the back of the left arm and some in the left leg.  She denies any weakness but she continues to have her dizziness that she has had for least 2 to 3 years.  She was seen here in 2019 and MRI of the brain at that time was normal.  The patient has some slight imbalance, she has not had any falls.  She continues to have troubles with panic attacks, she takes Ativan for this.  She at times may have episodes of shaking all over.  She will have headaches when she tries to focus on the computer, she has had these intermittent headaches over the last 4 to 6 weeks.  She denies any muffled hearing.  She denies trouble controlling the bowels or the bladder.  She comes to this office for further evaluation.  Past Medical History:  Diagnosis Date  . Anxiety   . Depression   . Dizzy 06/2018  . Gluten  intolerance   . Headache   . Hypothyroidism   . Thyroid disease    hypothyroid    Past Surgical History:  Procedure Laterality Date  . CHOLECYSTECTOMY      Family History  Problem Relation Age of Onset  . Hypertension Mother   . Heart failure Father 47  . Bipolar disorder Sister     Social history:  reports that she has never smoked. She has never used smokeless tobacco. She reports current alcohol use. She reports that she does not use drugs.  Medications:  Prior to Admission medications   Medication Sig Start Date End Date Taking? Authorizing Provider  Cholecalciferol (VITAMIN D3 PO) Take 2,000 Units by mouth daily.   Yes [provider]  levothyroxine (SYNTHROID) 137 MCG tablet Take 137 mcg by mouth every morning. 09/08/19  Yes [provider]  loratadine (CLARITIN) 10 MG tablet Take 10 mg by mouth daily.   Yes [provider]  LORazepam (ATIVAN) 0.5 MG tablet Take 0.5 mg by mouth every 12 (twelve) hours as needed for anxiety.  05/27/18  Yes [provider]  MAGNESIUM CITRATE PO Take 250 mg by mouth daily.   Yes [provider]  Omega-3 Fatty Acids (FISH OIL PO) Take 1,000 mg by mouth daily.   Yes [provider]  Allergies  Allergen Reactions  . Advil [Ibuprofen] Other (See Comments)    Kidney dysfunction  . Gluten Meal   . Penicillins Hives    And swelling    ROS:  Out of a complete 14 system review of symptoms, the patient complains only of the following symptoms, and all other reviewed systems are negative.  Dizziness Headache Numbness Slight balance problems Anxiety, panic attacks  Blood pressure 129/82, pulse 83, height 5\' 5"  (1.651 m), weight 174 lb (78.9 kg).  Physical Exam  General: The patient is alert and cooperative at the time of the examination.  The patient is minimally obese.  Eyes: Pupils are equal, round, and reactive to light. Disc margins appear to be somewhat indistinct  bilaterally, no hemorrhages are seen.  Neck: The neck is supple, no carotid bruits are noted.  Respiratory: The respiratory examination is clear.  Cardiovascular: The cardiovascular examination reveals a regular rate and rhythm, no obvious murmurs or rubs are noted.  Skin: Extremities are without significant edema.  Neurologic Exam  Mental status: The patient is alert and oriented x 3 at the time of the examination. The patient has apparent normal recent and remote memory, with an apparently normal attention span and concentration ability.  Cranial nerves: Facial symmetry is present. There is good sensation of the face to pinprick and soft touch bilaterally. The strength of the facial muscles and the muscles to head turning and shoulder shrug are normal bilaterally. Speech is well enunciated, no aphasia or dysarthria is noted. Extraocular movements are full. Visual fields are full. The tongue is midline, and the patient has symmetric elevation of the soft palate. No obvious hearing deficits are noted.  Motor: The motor testing reveals 5 over 5 strength of all 4 extremities. Good symmetric motor tone is noted throughout.  Sensory: Sensory testing is intact to pinprick, soft touch, vibration sensation, and position sense on all 4 extremities, with exception of some decreased pinprick sensation on the left forearm and the right lower leg. No evidence of extinction is noted.  Coordination: Cerebellar testing reveals good finger-nose-finger and heel-to-shin bilaterally.  Gait and station: Gait is normal. Tandem gait is normal. Romberg is negative. No drift is seen.  Reflexes: Deep tendon reflexes are symmetric and normal bilaterally. Toes are downgoing bilaterally.   Assessment/Plan:  1.  Reports of left-sided numbness, head pressure  2.  Episodic headaches  3.  Anxiety disorder, panic disorder  4.  Chronic dizziness  The clinical examination today is unremarkable with exception that  the optic disks appear to be somewhat indistinct, the patient is instructed to get an eye examination and have the report sent to me.  MRI of the brain will be done with and without contrast.  If her optometrist feels that there is some evidence of papilledema, a lumbar puncture will be done.  The patient will otherwise follow-up here in 3 months.  Neurologic examination otherwise was unremarkable.  Jill Alexanders MD 12/18/2019 7:23 AM  [name] [address] [place], [place] [address]  Phone [phone] Fax [phone]

## 2019-12-25 DIAGNOSIS — F411 Generalized anxiety disorder: Secondary | ICD-10-CM | POA: Diagnosis not present

## 2019-12-29 DIAGNOSIS — F410 Panic disorder [episodic paroxysmal anxiety] without agoraphobia: Secondary | ICD-10-CM | POA: Diagnosis not present

## 2019-12-29 DIAGNOSIS — R519 Headache, unspecified: Secondary | ICD-10-CM | POA: Diagnosis not present

## 2019-12-29 DIAGNOSIS — F411 Generalized anxiety disorder: Secondary | ICD-10-CM | POA: Diagnosis not present

## 2020-01-01 DIAGNOSIS — F411 Generalized anxiety disorder: Secondary | ICD-10-CM | POA: Diagnosis not present

## 2020-01-07 DIAGNOSIS — R200 Anesthesia of skin: Secondary | ICD-10-CM | POA: Diagnosis not present

## 2020-01-08 DIAGNOSIS — F411 Generalized anxiety disorder: Secondary | ICD-10-CM | POA: Diagnosis not present

## 2020-01-08 DIAGNOSIS — F410 Panic disorder [episodic paroxysmal anxiety] without agoraphobia: Secondary | ICD-10-CM | POA: Diagnosis not present

## 2020-01-09 ENCOUNTER — Telehealth: Payer: Self-pay | Admitting: Neurology

## 2020-01-09 NOTE — Telephone Encounter (Signed)
Pt would very much like to be called with results to MRI once availale

## 2020-01-12 ENCOUNTER — Ambulatory Visit: Payer: BLUE CROSS/BLUE SHIELD | Admitting: Neurology

## 2020-01-12 ENCOUNTER — Telehealth: Payer: Self-pay | Admitting: Neurology

## 2020-01-12 DIAGNOSIS — F410 Panic disorder [episodic paroxysmal anxiety] without agoraphobia: Secondary | ICD-10-CM | POA: Diagnosis not present

## 2020-01-12 DIAGNOSIS — F411 Generalized anxiety disorder: Secondary | ICD-10-CM | POA: Diagnosis not present

## 2020-01-12 NOTE — Telephone Encounter (Signed)
I called the patient.  MRI of the brain was normal.  The patient claims that she was seen by an eye doctor shortly after I saw her in early March, the evaluation she claims was normal.  No indication for further work-up.  MRI of the brain was done with and without contrast, done at [facility] health.   MRI brain 01/08/20:  Impression:  Normal MRI of the brain.

## 2020-01-12 NOTE — Telephone Encounter (Signed)
PT was call by Dr.Willis today for her MRI results.

## 2020-01-19 DIAGNOSIS — F411 Generalized anxiety disorder: Secondary | ICD-10-CM | POA: Diagnosis not present

## 2020-01-19 DIAGNOSIS — F410 Panic disorder [episodic paroxysmal anxiety] without agoraphobia: Secondary | ICD-10-CM | POA: Diagnosis not present

## 2020-01-19 DIAGNOSIS — T50B95A Adverse effect of other viral vaccines, initial encounter: Secondary | ICD-10-CM | POA: Diagnosis not present

## 2020-01-26 DIAGNOSIS — F411 Generalized anxiety disorder: Secondary | ICD-10-CM | POA: Diagnosis not present

## 2020-01-26 DIAGNOSIS — F410 Panic disorder [episodic paroxysmal anxiety] without agoraphobia: Secondary | ICD-10-CM | POA: Diagnosis not present

## 2020-01-27 DIAGNOSIS — F410 Panic disorder [episodic paroxysmal anxiety] without agoraphobia: Secondary | ICD-10-CM | POA: Diagnosis not present

## 2020-01-27 DIAGNOSIS — F411 Generalized anxiety disorder: Secondary | ICD-10-CM | POA: Diagnosis not present

## 2020-02-02 DIAGNOSIS — F410 Panic disorder [episodic paroxysmal anxiety] without agoraphobia: Secondary | ICD-10-CM | POA: Diagnosis not present

## 2020-02-02 DIAGNOSIS — F411 Generalized anxiety disorder: Secondary | ICD-10-CM | POA: Diagnosis not present

## 2020-02-09 DIAGNOSIS — F411 Generalized anxiety disorder: Secondary | ICD-10-CM | POA: Diagnosis not present

## 2020-02-17 DIAGNOSIS — F411 Generalized anxiety disorder: Secondary | ICD-10-CM | POA: Diagnosis not present

## 2020-02-25 DIAGNOSIS — F411 Generalized anxiety disorder: Secondary | ICD-10-CM | POA: Diagnosis not present

## 2020-03-02 DIAGNOSIS — F410 Panic disorder [episodic paroxysmal anxiety] without agoraphobia: Secondary | ICD-10-CM | POA: Diagnosis not present

## 2020-03-02 DIAGNOSIS — F411 Generalized anxiety disorder: Secondary | ICD-10-CM | POA: Diagnosis not present

## 2020-03-03 ENCOUNTER — Ambulatory Visit (INDEPENDENT_AMBULATORY_CARE_PROVIDER_SITE_OTHER): Payer: BC Managed Care – PPO | Admitting: Family Medicine

## 2020-03-03 ENCOUNTER — Encounter: Payer: Self-pay | Admitting: Family Medicine

## 2020-03-03 ENCOUNTER — Other Ambulatory Visit: Payer: Self-pay

## 2020-03-03 VITALS — BP 122/68 | HR 98 | Temp 98.2°F | Ht 64.0 in | Wt 172.0 lb

## 2020-03-03 DIAGNOSIS — E063 Autoimmune thyroiditis: Secondary | ICD-10-CM

## 2020-03-03 DIAGNOSIS — R202 Paresthesia of skin: Secondary | ICD-10-CM

## 2020-03-03 DIAGNOSIS — F419 Anxiety disorder, unspecified: Secondary | ICD-10-CM | POA: Diagnosis not present

## 2020-03-03 DIAGNOSIS — E038 Other specified hypothyroidism: Secondary | ICD-10-CM

## 2020-03-03 DIAGNOSIS — R635 Abnormal weight gain: Secondary | ICD-10-CM

## 2020-03-03 LAB — TSH: TSH: 5.4 u[IU]/mL — ABNORMAL HIGH (ref 0.35–4.50)

## 2020-03-03 LAB — VITAMIN B12: Vitamin B-12: 534 pg/mL (ref 211–911)

## 2020-03-03 LAB — VITAMIN D 25 HYDROXY (VIT D DEFICIENCY, FRACTURES): VITD: 33.67 ng/mL (ref 30.00–100.00)

## 2020-03-03 LAB — T4, FREE: Free T4: 0.63 ng/dL (ref 0.60–1.60)

## 2020-03-03 NOTE — Patient Instructions (Signed)
Set up 2 week follow up for 30 minute visit

## 2020-03-03 NOTE — Progress Notes (Signed)
Subjective:     Patient ID: Kara Patton, female   DOB: 1981-02-11, 39 y.o.   MRN: [id number]  HPI   Kara Patton is seen to establish care.  She has history of hypothyroidism with past history of Hashimoto's thyroiditis.  She has had noncardiac chest pain which has been worked up by cardiology.  She has had some previous dizziness and near syncope.  She has had previous echo which was unremarkable.  She takes levothyroxine and her listed dose was 137 mcg but she states that when she went on this dose she had increased palpitations and she reduced this herself to 125 mcg around February.  That was when she had normal TSH.  She has not had follow-up since then.  She has long history of anxiety disorders with what sounds like possible panic disorder.  She has been tried on multiple SSRIs including Lexapro, Prozac, sertraline, Remeron but had side effects with all those.  She recalls trying BuSpar but had side effects.  She currently sees psychiatry and is on low-dose lorazepam twice daily.  Has had some weight gain in the past year.  She states that she tends to fluctuate with weight considerably in the past.  She had some paresthesias on her face and occasionally extremities and saw neurology and had MRI brain recently was unremarkable.  She would like to check B12 levels.  She also is requesting vitamin D level.  She is married.  She has 2 children ages 79 and 39.  Non-smoker.  No alcohol use.  Works in the home.  Her husband is a Geophysicist/field seismologist.  Past Medical History:  Diagnosis Date  . Anxiety   . Depression   . Dizzy 06/2018  . Gluten intolerance   . Headache   . Hypothyroidism   . Panic disorder 12/18/2019  . Thyroid disease    hypothyroid   Past Surgical History:  Procedure Laterality Date  . CHOLECYSTECTOMY      reports that she has never smoked. She has never used smokeless tobacco. She reports current alcohol use. She reports that she does not use drugs. family history includes Bipolar  disorder in her sister; Diabetes in her mother; Heart disease (age of onset: 73) in her father; Heart failure (age of onset: 10) in her father; Hypertension in her mother; Multiple sclerosis in her mother. Allergies  Allergen Reactions  . Advil [Ibuprofen] Other (See Comments)    Kidney dysfunction  . Antihistamines, Diphenhydramine-Type     Increased anxiety  . Gluten Meal   . Penicillins Hives    And swelling   Wt Readings from Last 3 Encounters:  03/03/20 172 lb (78 kg)  12/18/19 174 lb (78.9 kg)  12/12/19 176 lb (79.8 kg)     Review of Systems  Constitutional: Negative for appetite change, chills, fever and unexpected weight change.  Cardiovascular: Positive for palpitations. Negative for leg swelling.  Gastrointestinal: Negative for abdominal pain.  Genitourinary: Negative for dysuria.  Musculoskeletal: Positive for arthralgias.       Objective:   Physical Exam Vitals reviewed.  Constitutional:      Appearance: Normal appearance.  Cardiovascular:     Rate and Rhythm: Normal rate and regular rhythm.     Heart sounds: No murmur. No gallop.   Pulmonary:     Effort: Pulmonary effort is normal.     Breath sounds: Normal breath sounds.  Musculoskeletal:     Cervical back: Neck supple.     Right lower leg: No edema.  Left lower leg: No edema.  Lymphadenopathy:     Cervical: No cervical adenopathy.  Neurological:     Mental Status: She is alert.        Assessment:     #1 hypothyroidism with history of reported Hashimoto's thyroiditis.  She had some weight gain in the past year and she reduced her dose of levothyroxine as above 137 to 125 few months ago.  No follow-up since then.  #2 history of anxiety and possible panic disorder.  Prior intolerance with multiple SSRIs  #3 history of noncardiac chest pain.  Worked up previously by cardiology  #4 multiple somatic complaints including significant arthralgias.  She has had significant paresthesias which are  somewhat migratory with recent unremarkable MRI brain    Plan:     -We will check TSH, free T4, B12, 25-hydroxy vitamin D - schedule II or 3-week follow-up for 30-minute appointment.  We will delve into some of her joint pains at that time. -We reviewed multiple previous records today including recent imaging studies and recent labs -we spent 45 minutes reviewing prior records/testing/labs and evaluating current health issues.  Kristian Covey MD  Primary Care at [facility]

## 2020-03-08 DIAGNOSIS — F411 Generalized anxiety disorder: Secondary | ICD-10-CM | POA: Diagnosis not present

## 2020-03-08 DIAGNOSIS — F410 Panic disorder [episodic paroxysmal anxiety] without agoraphobia: Secondary | ICD-10-CM | POA: Diagnosis not present

## 2020-03-16 DIAGNOSIS — F411 Generalized anxiety disorder: Secondary | ICD-10-CM | POA: Diagnosis not present

## 2020-03-17 ENCOUNTER — Encounter: Payer: Self-pay | Admitting: Family Medicine

## 2020-03-17 ENCOUNTER — Ambulatory Visit (INDEPENDENT_AMBULATORY_CARE_PROVIDER_SITE_OTHER): Payer: BC Managed Care – PPO | Admitting: Family Medicine

## 2020-03-17 ENCOUNTER — Other Ambulatory Visit: Payer: Self-pay

## 2020-03-17 VITALS — BP 120/62 | HR 96 | Temp 97.7°F | Wt 172.0 lb

## 2020-03-17 DIAGNOSIS — M2550 Pain in unspecified joint: Secondary | ICD-10-CM

## 2020-03-17 LAB — C-REACTIVE PROTEIN: CRP: <1 mg/dL (ref 0.5–20.0)

## 2020-03-17 LAB — SEDIMENTATION RATE: Sed Rate: 5 mm/hr (ref 0–20)

## 2020-03-17 MED ORDER — LEVOTHYROXINE SODIUM 125 MCG PO TABS: 125.0000 ug | ORAL_TABLET | Freq: Every day | ORAL | 3 refills | Status: DC

## 2020-03-17 NOTE — Patient Instructions (Signed)

## 2020-03-17 NOTE — Progress Notes (Signed)
Subjective:     Patient ID: Kara Patton, female   DOB: Sep 15, 1981, 39 y.o.   MRN: [id number]  HPI   Kara Patton is here to discuss her polyarthralgias in more detail.  She just established recently and mentioned briefly as we were getting ready to leave the room last visit that she had several years of some arthralgias.  She states that even when she was up in [place] over 3 years ago she had significant joint pains and at some point had some type of screening for things like lupus which was negative.  She describes daily joint pain and some stiffness involving multiple joints of the hand, ankles, and knees.  She has not seen any signs of objective inflammation such as redness or any warmth.  Her hand involvement is mostly MCP and PIP joints.  She has stiffness which usually last about 30 minutes and then improves with mobility.  She has had previous adverse reaction with nonsteroidals and occasionally takes Tylenol.  No known family history of inflammatory arthritis.  She denies any significant myalgias.  No skin rashes.  She did move here from [place] region but denies any tick bite history up there.  She is not aware of any prior history of lupus.  Her mother did have "arthritis "but she thinks this was osteoarthritis  Past Medical History:  Diagnosis Date  . Anxiety   . Depression   . Dizzy 06/2018  . Gluten intolerance   . Headache   . Hypothyroidism   . Panic disorder 12/18/2019  . Thyroid disease    hypothyroid   Past Surgical History:  Procedure Laterality Date  . CHOLECYSTECTOMY      reports that she has never smoked. She has never used smokeless tobacco. She reports current alcohol use. She reports that she does not use drugs. family history includes Bipolar disorder in her sister; Diabetes in her mother; Heart disease (age of onset: 90) in her father; Heart failure (age of onset: 4) in her father; Hypertension in her mother; Multiple sclerosis in her mother. Allergies  Allergen  Reactions  . Advil [Ibuprofen] Other (See Comments)    Kidney dysfunction  . Antihistamines, Diphenhydramine-Type     Increased anxiety  . Gluten Meal   . Penicillins Hives    And swelling     Review of Systems  Constitutional: Negative for appetite change, chills, fever and unexpected weight change.  Respiratory: Negative for shortness of breath.   Cardiovascular: Negative for chest pain.  Musculoskeletal: Positive for arthralgias.  Skin: Negative for rash.  Neurological: Negative for weakness.  Hematological: Negative for adenopathy.       Objective:   Physical Exam Vitals reviewed.  Constitutional:      Appearance: Normal appearance.  Cardiovascular:     Rate and Rhythm: Normal rate and regular rhythm.  Pulmonary:     Effort: Pulmonary effort is normal.     Breath sounds: Normal breath sounds.  Musculoskeletal:     Right lower leg: No edema.     Left lower leg: No edema.     Comments: Hands, knees, ankles examined.  Full range of motion.  No evidence for inflammation, redness, warmth.  Neurological:     Mental Status: She is alert.        Assessment:     Polyarthralgias.  Her history of chronic joint involvement and gradual onset without objective evidence for inflammation suggests more likely osteoarthritis but she is only 38.    Plan:     -  We recommend checking some further labs including sed rate, C-reactive protein, antinuclear antibody, rheumatoid factor, CCP antibody, Lyme antibodies -Avoid oral nonsteroidals because of her prior history of intolerance-renal issues. -Continue Tylenol for now -Discussed importance of weight control and arthritis with managing osteoarthritis  Eulas Post MD [facility]

## 2020-03-18 LAB — RHEUMATOID FACTOR: Rheumatoid fact SerPl-aCnc: <14 IU/mL (ref <14)

## 2020-03-18 LAB — CYCLIC CITRUL PEPTIDE ANTIBODY, IGG: Cyclic Citrullin Peptide Ab: <16 UNITS

## 2020-03-18 LAB — B. BURGDORFI ANTIBODIES: B burgdorferi Ab IgG+IgM: <0.9 index

## 2020-03-18 LAB — ANA: Anti Nuclear Antibody (ANA): NEGATIVE

## 2020-03-23 DIAGNOSIS — F410 Panic disorder [episodic paroxysmal anxiety] without agoraphobia: Secondary | ICD-10-CM | POA: Diagnosis not present

## 2020-03-23 DIAGNOSIS — F411 Generalized anxiety disorder: Secondary | ICD-10-CM | POA: Diagnosis not present

## 2020-03-30 DIAGNOSIS — F410 Panic disorder [episodic paroxysmal anxiety] without agoraphobia: Secondary | ICD-10-CM | POA: Diagnosis not present

## 2020-03-30 DIAGNOSIS — F411 Generalized anxiety disorder: Secondary | ICD-10-CM | POA: Diagnosis not present

## 2020-04-06 DIAGNOSIS — F411 Generalized anxiety disorder: Secondary | ICD-10-CM | POA: Diagnosis not present

## 2020-04-07 DIAGNOSIS — F411 Generalized anxiety disorder: Secondary | ICD-10-CM | POA: Diagnosis not present

## 2020-04-07 DIAGNOSIS — F410 Panic disorder [episodic paroxysmal anxiety] without agoraphobia: Secondary | ICD-10-CM | POA: Diagnosis not present

## 2020-04-13 DIAGNOSIS — F411 Generalized anxiety disorder: Secondary | ICD-10-CM | POA: Diagnosis not present

## 2020-04-20 DIAGNOSIS — F411 Generalized anxiety disorder: Secondary | ICD-10-CM | POA: Diagnosis not present

## 2020-04-29 DIAGNOSIS — F410 Panic disorder [episodic paroxysmal anxiety] without agoraphobia: Secondary | ICD-10-CM | POA: Diagnosis not present

## 2020-04-29 DIAGNOSIS — F411 Generalized anxiety disorder: Secondary | ICD-10-CM | POA: Diagnosis not present

## 2020-05-06 DIAGNOSIS — F410 Panic disorder [episodic paroxysmal anxiety] without agoraphobia: Secondary | ICD-10-CM | POA: Diagnosis not present

## 2020-05-06 DIAGNOSIS — F411 Generalized anxiety disorder: Secondary | ICD-10-CM | POA: Diagnosis not present

## 2020-05-12 DIAGNOSIS — F410 Panic disorder [episodic paroxysmal anxiety] without agoraphobia: Secondary | ICD-10-CM | POA: Diagnosis not present

## 2020-05-12 DIAGNOSIS — F411 Generalized anxiety disorder: Secondary | ICD-10-CM | POA: Diagnosis not present

## 2020-05-26 DIAGNOSIS — L811 Chloasma: Secondary | ICD-10-CM | POA: Diagnosis not present

## 2020-05-26 DIAGNOSIS — D485 Neoplasm of uncertain behavior of skin: Secondary | ICD-10-CM | POA: Diagnosis not present

## 2020-05-26 DIAGNOSIS — L910 Hypertrophic scar: Secondary | ICD-10-CM | POA: Diagnosis not present

## 2020-05-31 DIAGNOSIS — F410 Panic disorder [episodic paroxysmal anxiety] without agoraphobia: Secondary | ICD-10-CM | POA: Diagnosis not present

## 2020-05-31 DIAGNOSIS — F411 Generalized anxiety disorder: Secondary | ICD-10-CM | POA: Diagnosis not present

## 2020-06-07 DIAGNOSIS — F411 Generalized anxiety disorder: Secondary | ICD-10-CM | POA: Diagnosis not present

## 2020-06-07 DIAGNOSIS — F410 Panic disorder [episodic paroxysmal anxiety] without agoraphobia: Secondary | ICD-10-CM | POA: Diagnosis not present

## 2020-06-15 DIAGNOSIS — F411 Generalized anxiety disorder: Secondary | ICD-10-CM | POA: Diagnosis not present

## 2020-06-15 DIAGNOSIS — F410 Panic disorder [episodic paroxysmal anxiety] without agoraphobia: Secondary | ICD-10-CM | POA: Diagnosis not present

## 2020-06-23 ENCOUNTER — Encounter: Payer: Self-pay | Admitting: Family Medicine

## 2020-06-23 ENCOUNTER — Other Ambulatory Visit: Payer: Self-pay

## 2020-06-23 ENCOUNTER — Telehealth (INDEPENDENT_AMBULATORY_CARE_PROVIDER_SITE_OTHER): Payer: BC Managed Care – PPO | Admitting: Family Medicine

## 2020-06-23 VITALS — Temp 97.8°F | Ht 64.0 in | Wt 171.0 lb

## 2020-06-23 DIAGNOSIS — F410 Panic disorder [episodic paroxysmal anxiety] without agoraphobia: Secondary | ICD-10-CM | POA: Diagnosis not present

## 2020-06-23 DIAGNOSIS — F411 Generalized anxiety disorder: Secondary | ICD-10-CM | POA: Diagnosis not present

## 2020-06-23 DIAGNOSIS — R519 Headache, unspecified: Secondary | ICD-10-CM | POA: Diagnosis not present

## 2020-06-23 NOTE — Progress Notes (Signed)
Patient ID: Kara Patton, female   DOB: 04-Mar-1981, 39 y.o.   MRN: [id number]

## 2020-06-23 NOTE — Progress Notes (Signed)
Patient ID: Kara Patton, female   DOB: [birth year], 39 y.o.   MRN: [id number]   This visit type was conducted due to national recommendations for restrictions regarding the COVID-19 pandemic in an effort to limit this patient's exposure and mitigate transmission in our community.   Virtual Visit via Video Note  I connected with Kara Patton on 06/23/20 at  5:30 PM EDT by a video enabled telemedicine application and verified that I am speaking with the correct person using two identifiers.  Location patient: home Location provider:work or home office Persons participating in the virtual visit: patient, provider  I discussed the limitations of evaluation and management by telemedicine and the availability of in person appointments. The patient expressed understanding and agreed to proceed.   HPI: Kara Patton called with left frontal headache for the past 4 days or so.  She states she has history of migraine headaches in the past but this is different.  Her migraines tend to cause nausea, light sensitivity and worse severity with activity and this one is not associated with those factors.  Her headache seems to get somewhat worse throughout the day.  No clear triggering factors.  Mild severity rated 2 out of 10.  Nonthrobbing.  No associated nausea or vomiting.  No fever.  Denies any nasal congestion.  She has taken some Tylenol without resolution.  Denies any cognitive changes or any focal neurologic concerns.  Past Medical History:  Diagnosis Date  . Anxiety   . Depression   . Dizzy 06/2018  . Gluten intolerance   . Headache   . Hypothyroidism   . Panic disorder 12/18/2019  . Thyroid disease    hypothyroid   Past Surgical History:  Procedure Laterality Date  . CHOLECYSTECTOMY      reports that she has never smoked. She has never used smokeless tobacco. She reports current alcohol use. She reports that she does not use drugs. family history includes Bipolar disorder in her sister; Diabetes in her  mother; Heart disease (age of onset: 59) in her father; Heart failure (age of onset: 78) in her father; Hypertension in her mother; Multiple sclerosis in her mother. Allergies  Allergen Reactions  . Advil [Ibuprofen] Other (See Comments)    Kidney dysfunction  . Antihistamines, Diphenhydramine-Type     Increased anxiety  . Gluten Meal   . Penicillins Hives    And swelling      ROS: See pertinent positives and negatives per HPI.  Past Medical History:  Diagnosis Date  . Anxiety   . Depression   . Dizzy 06/2018  . Gluten intolerance   . Headache   . Hypothyroidism   . Panic disorder 12/18/2019  . Thyroid disease    hypothyroid    Past Surgical History:  Procedure Laterality Date  . CHOLECYSTECTOMY      Family History  Problem Relation Age of Onset  . Hypertension Mother   . Multiple sclerosis Mother   . Diabetes Mother   . Heart failure Father 80  . Heart disease Father 34  . Bipolar disorder Sister     SOCIAL HX: Non-smoker.  Married with 2 children.   Current Outpatient Medications:  .  Cholecalciferol (VITAMIN D3 PO), Take 2,000 Units by mouth daily., Disp: , Rfl:  .  levothyroxine (SYNTHROID) 125 MCG tablet, Take 1 tablet (125 mcg total) by mouth daily before breakfast., Disp: 90 tablet, Rfl: 3 .  loratadine (CLARITIN) 10 MG tablet, Take 10 mg by mouth daily., Disp: , Rfl:  .  LORazepam (ATIVAN) 0.5 MG tablet, Take 0.5 mg by mouth every 12 (twelve) hours as needed for anxiety. , Disp: , Rfl: 1 .  MAGNESIUM CITRATE PO, Take 250 mg by mouth daily., Disp: , Rfl:  .  Omega-3 Fatty Acids (FISH OIL PO), Take 1,000 mg by mouth daily., Disp: , Rfl:  .  ALPRAZolam (XANAX) 0.5 MG tablet, Take 2 tablets approximately 45 minutes prior to the MRI study, take a third tablet if needed., Disp: 3 tablet, Rfl: 0  EXAM:  VITALS per patient if applicable:  GENERAL: alert, oriented, appears well and in no acute distress  HEENT: atraumatic, conjunttiva clear, no obvious  abnormalities on inspection of external nose and ears  NECK: normal movements of the head and neck  LUNGS: on inspection no signs of respiratory distress, breathing rate appears normal, no obvious gross SOB, gasping or wheezing  CV: no obvious cyanosis  MS: moves all visible extremities without noticeable abnormality  PSYCH/NEURO: pleasant and cooperative, no obvious depression or anxiety, speech and thought processing grossly intact  ASSESSMENT AND PLAN:  Discussed the following assessment and plan:  Left frontal/retro-orbital headache.  She describes headache which is different from her usual migraines.  Very mild severity at this time.  Differential would include sinusitis but she denies any nasal congestive symptoms or purulent secretions.  -We suggested conservative treatment with trial of Sudafed and topical heat to see if this helps. -Follow-up promptly for any progressive headache, persistent headache, recurrent vomiting, or any focal neurologic deficits     I discussed the assessment and treatment plan with the patient. The patient was provided an opportunity to ask questions and all were answered. The patient agreed with the plan and demonstrated an understanding of the instructions.   The patient was advised to call back or seek an in-person evaluation if the symptoms worsen or if the condition fails to improve as anticipated.     Evelena Peat, MD

## 2020-06-30 ENCOUNTER — Other Ambulatory Visit: Payer: Self-pay

## 2020-06-30 ENCOUNTER — Ambulatory Visit: Payer: BC Managed Care – PPO | Admitting: Family Medicine

## 2020-06-30 ENCOUNTER — Encounter: Payer: Self-pay | Admitting: Family Medicine

## 2020-06-30 VITALS — BP 102/62 | HR 78 | Temp 98.3°F | Ht 64.0 in | Wt 175.0 lb

## 2020-06-30 DIAGNOSIS — E038 Other specified hypothyroidism: Secondary | ICD-10-CM

## 2020-06-30 DIAGNOSIS — E063 Autoimmune thyroiditis: Secondary | ICD-10-CM | POA: Diagnosis not present

## 2020-06-30 DIAGNOSIS — R29898 Other symptoms and signs involving the musculoskeletal system: Secondary | ICD-10-CM | POA: Diagnosis not present

## 2020-06-30 NOTE — Progress Notes (Signed)
Established Patient Office Visit  Subjective:  Patient ID: Kara Patton, female    DOB: Jul 23, 1981  Age: 39 y.o. MRN: [id number]  CC:  Chief Complaint  Patient presents with  . Follow-up    HPI Kara Patton presents for question of weakness lower extremities which came on fairly acutely about 2 days ago.  She states she had very similar episode in college and had extensive work-up done including lumbar tap and MRI of the brain which was unremarkable.  She had recent atypical frontal headache which is essentially resolved.  No fever.  No chills.  Denies any low back pain.  No sensory deficits.  No urine or stool incontinence.  She noted couple days ago she had sensation that her legs "felt heavy ".  She states that she felt like she was "walking and glue ".  Denies any upper extremity weakness.  Patient states she had MRI of the brain March 24 another clinic and was reportedly normal.  She does have hypothyroidism and is on replacement.  Her TSH was over 5 when we checked her labs last summer.  She has been consistently taking levothyroxine 125 mcg daily.  Recent vitamin D level normal B12 level normal.  Past Medical History:  Diagnosis Date  . Anxiety   . Depression   . Dizzy 06/2018  . Gluten intolerance   . Headache   . Hypothyroidism   . Panic disorder 12/18/2019  . Thyroid disease    hypothyroid    Past Surgical History:  Procedure Laterality Date  . CHOLECYSTECTOMY      Family History  Problem Relation Age of Onset  . Hypertension Mother   . Multiple sclerosis Mother   . Diabetes Mother   . Heart failure Father 72  . Heart disease Father 81  . Bipolar disorder Sister     Social History   Socioeconomic History  . Marital status: Married    Spouse name: Not on file  . Number of children: 2  . Years of education: Graduate school  . Highest education level: Not on file  Occupational History  . 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 Smoking status: Never  Smoker  . Smokeless tobacco: Never Used  Vaping Use  . Vaping Use: Never used  Substance and Sexual Activity  . Alcohol use: Yes    Comment: 1-2 per month  . Drug use: Never  . Sexual activity: Yes  Other Topics Concern  . Not on file  Social History Narrative   Lives in [place] w/ husband and 2 kids.   Caffeine use: 1x/week or less- coffee   Right handed    Social Determinants of Health   Financial Resource Strain:   . Difficulty of Paying Living Expenses: Not on file  Food Insecurity:   . Worried About Programme researcher, broadcasting/film/video in the Last Year: Not on file  . Ran Out of Food in the Last Year: Not on file  Transportation Needs:   . Lack of Transportation (Medical): Not on file  . Lack of Transportation (Non-Medical): Not on file  Physical Activity:   . Days of Exercise per Week: Not on file  . Minutes of Exercise per Session: Not on file  Stress:   . Feeling of Stress : Not on file  Social Connections:   . Frequency of Communication with Friends and Family: Not on file  . Frequency of Social Gatherings with Friends and Family: Not on file  . Attends Religious Services: Not  on file  . Active Member of Clubs or Organizations: Not on file  . Attends Banker Meetings: Not on file  . Marital Status: Not on file  Intimate Partner Violence:   . Fear of Current or Ex-Partner: Not on file  . Emotionally Abused: Not on file  . Physically Abused: Not on file  . Sexually Abused: Not on file    Outpatient Medications Prior to Visit  Medication Sig Dispense Refill  . ALPRAZolam (XANAX) 0.5 MG tablet Take 2 tablets approximately 45 minutes prior to the MRI study, take a third tablet if needed. 3 tablet 0  . Cholecalciferol (VITAMIN D3 PO) Take 2,000 Units by mouth daily.    Marland Kitchen levothyroxine (SYNTHROID) 125 MCG tablet Take 1 tablet (125 mcg total) by mouth daily before breakfast. 90 tablet 3  . loratadine (CLARITIN) 10 MG tablet Take 10 mg by mouth daily.    Marland Kitchen LORazepam  (ATIVAN) 0.5 MG tablet Take 0.5 mg by mouth every 12 (twelve) hours as needed for anxiety.   1  . MAGNESIUM CITRATE PO Take 250 mg by mouth daily.    . Omega-3 Fatty Acids (FISH OIL PO) Take 1,000 mg by mouth daily.     No facility-administered medications prior to visit.    Allergies  Allergen Reactions  . Advil [Ibuprofen] Other (See Comments)    Kidney dysfunction  . Antihistamines, Diphenhydramine-Type     Increased anxiety  . Gluten Meal   . Penicillins Hives    And swelling    ROS Review of Systems  Constitutional: Negative for chills and fever.  Respiratory: Negative for cough and shortness of breath.   Cardiovascular: Negative for chest pain.  Gastrointestinal: Negative for abdominal pain.  Musculoskeletal: Negative for back pain.  Neurological: Positive for weakness. Negative for dizziness, tremors, seizures, syncope, facial asymmetry, speech difficulty and numbness.      Objective:    Physical Exam Vitals reviewed.  Constitutional:      Appearance: Normal appearance.  Cardiovascular:     Rate and Rhythm: Normal rate and regular rhythm.  Pulmonary:     Effort: Pulmonary effort is normal.     Breath sounds: Normal breath sounds.  Musculoskeletal:     Right lower leg: No edema.     Left lower leg: No edema.  Neurological:     General: No focal deficit present.     Mental Status: She is alert and oriented to person, place, and time.     Cranial Nerves: No cranial nerve deficit.     Sensory: No sensory deficit.     Coordination: Coordination normal.     Gait: Gait normal.     Deep Tendon Reflexes: Reflexes normal.     Comments: She seems to have some mild weakness bilaterally with hip flexion and knee extension but preserved strength with plantarflexion and dorsiflexion.  No sensory impairment to touch or monofilament throughout lower extremities.  Deep tendon reflexes are 2+ ankle knee bilaterally.  She had no difficulty getting up out of a chair unassisted or  getting on the table and down unassisted.  She is able to squat and stand up without any assistance with no difficulty.  Gait normal     BP 102/62   Pulse 78   Temp 98.3 F (36.8 C) (Oral)   Ht 5\' 4"  (1.626 m)   Wt 175 lb (79.4 kg)   LMP 06/18/2020   SpO2 99%   BMI 30.04 kg/m  Wt Readings from Last 3  Encounters:  06/30/20 175 lb (79.4 kg)  06/23/20 171 lb (77.6 kg)  03/17/20 172 lb (78 kg)     Health Maintenance Due  Topic Date Due  . Hepatitis C Screening  Never done  . TETANUS/TDAP  Never done  . PAP SMEAR-Modifier  Never done  . INFLUENZA VACCINE  Never done    There are no preventive care reminders to display for this patient.  Lab Results  Component Value Date   TSH 5.40 (H) 03/03/2020   Lab Results  Component Value Date   WBC 5.7 11/26/2019   HGB 14.4 11/26/2019   HCT 42.7 11/26/2019   MCV 91.6 11/26/2019   PLT 226 11/26/2019   Lab Results  Component Value Date   NA 139 11/26/2019   K 4.1 11/26/2019   CO2 25 11/26/2019   GLUCOSE 99 11/26/2019   BUN 12 11/26/2019   CREATININE 0.70 11/26/2019   BILITOT 0.7 06/20/2018   ALKPHOS 29 (L) 06/20/2018   AST 12 (L) 06/20/2018   ALT 11 06/20/2018   PROT 6.3 (L) 06/20/2018   ALBUMIN 3.7 06/20/2018   CALCIUM 8.9 11/26/2019   ANIONGAP 11 11/26/2019   Lab Results  Component Value Date   CHOL 121 06/19/2018   Lab Results  Component Value Date   HDL 32 (L) 06/19/2018   Lab Results  Component Value Date   LDLCALC 74 06/19/2018   Lab Results  Component Value Date   TRIG 77 06/19/2018   Lab Results  Component Value Date   CHOLHDL 3.8 06/19/2018   Lab Results  Component Value Date   HGBA1C 4.9 06/19/2018      Assessment & Plan:   Problem List Items Addressed This Visit      Unprioritized   Hypothyroidism due to Hashimoto's thyroiditis - Primary   Relevant Orders   TSH   T4, Free    Other Visit Diagnoses    Weakness of both lower extremities        Patient presents with reported  weakness lower extremities without neurologic findings of sensory deficit or any loss of bladder or bowel control.  She does not show any areflexia to suggest likely Guillain Barr syndrome.  She interestingly states she had very similar type syndrome back in college which resolved.  She had reported recent MRI of the brain back March 24 which was unremarkable.  We explained things like transverse myelitis would be in differential.  She does not have any sensory deficits or associated pain.  No generalized weakness.  -Recheck TSH and free T4. -We reviewed things to watch for such as any progressive weakness, sensory deficits, or loss of bladder or bowel control. -Low threshold to consult neurology and or MRI lumbar spine if symptoms not resolving next couple days.  She had some weakness with hip flexion knee extension when sitting on the table but had no difficulty squatting and getting up unassisted nor with ambulation -Schedule office visit in 2 weeks to reassess  No orders of the defined types were placed in this encounter.   Follow-up: Return in about 2 weeks (around 07/14/2020).    Evelena Peat, MD

## 2020-06-30 NOTE — Patient Instructions (Signed)
Follow up immediately for any progressive weakness, numbness, or any loss of urine of bowel control.

## 2020-07-01 DIAGNOSIS — F410 Panic disorder [episodic paroxysmal anxiety] without agoraphobia: Secondary | ICD-10-CM | POA: Diagnosis not present

## 2020-07-01 DIAGNOSIS — F411 Generalized anxiety disorder: Secondary | ICD-10-CM | POA: Diagnosis not present

## 2020-07-01 LAB — T4, FREE: Free T4: 1.5 ng/dL (ref 0.8–1.8)

## 2020-07-01 LAB — TSH: TSH: 0.17 mIU/L — ABNORMAL LOW

## 2020-07-06 DIAGNOSIS — F411 Generalized anxiety disorder: Secondary | ICD-10-CM | POA: Diagnosis not present

## 2020-07-06 DIAGNOSIS — F410 Panic disorder [episodic paroxysmal anxiety] without agoraphobia: Secondary | ICD-10-CM | POA: Diagnosis not present

## 2020-07-13 ENCOUNTER — Other Ambulatory Visit: Payer: Self-pay

## 2020-07-13 ENCOUNTER — Ambulatory Visit: Payer: BC Managed Care – PPO | Admitting: Cardiology

## 2020-07-13 ENCOUNTER — Encounter: Payer: Self-pay | Admitting: Cardiology

## 2020-07-13 VITALS — BP 111/73 | HR 73 | Ht 64.5 in | Wt 173.0 lb

## 2020-07-13 DIAGNOSIS — Z7189 Other specified counseling: Secondary | ICD-10-CM

## 2020-07-13 DIAGNOSIS — R072 Precordial pain: Secondary | ICD-10-CM

## 2020-07-13 DIAGNOSIS — R5383 Other fatigue: Secondary | ICD-10-CM

## 2020-07-13 DIAGNOSIS — R002 Palpitations: Secondary | ICD-10-CM

## 2020-07-13 NOTE — Patient Instructions (Signed)

## 2020-07-13 NOTE — Progress Notes (Signed)
Cardiology Office Note:    Date:  07/13/2020   ID:  Kara Patton, DOB [birth year], MRN [id number]  PCP:  Kristian Covey, MD  Cardiologist:  Jodelle Red, MD PhD  CC: follow up  History of Present Illness:    Kara Patton is a 39 y.o. female with a hx of hypothyroidism 2/2 Hashimotos's thyroiditis who is seen in follow up today. Her initial consult with me was on 08/06/18.  Cardiac history: Hospitalized 06/2018 for chest pain, nausea, weakness, and dizziness. She had an extensive workup at that time, including bloodwork, cosyntropin stim test, CT chest/abd/pelvise, echo, MRI brain. All within normal limits. She was seen by Dr. Jens Som for a cardiology consult while inpatient, and the recommendation was for no further cardiology workup. She was recommended for neurology follow up for headaches and facial parasthesias. Her initial outpatient concerns included: Constantly exhausted, HR rises just from walking to the 120s-130s. Chest pain is ongoing and uncomfortable. Headaches in the back of the head.   Today: Reviewed recent note from Dr. Caryl Never. Has follow up again with him tomorrow to address leg weakness. She does not feel that this weakness is interfering with anything except climbing stairs. Feels is it stable, not worsening.  Chest pain unchanged. Breathing has been good overall, other than occasional allergies. Still with palpitations, able to manage these. Has an Apple watch, checks an ECG, always tells her it is normal rhythm.  Working on increasing activity. Walks with her husband about 1.5 miles in 30 minutes, most days. Does not feel limited, does not need to stop.  Denies shortness of breath at rest or with normal exertion. No PND, orthopnea, LE edema or unexpected weight gain. No syncope.  Past Medical History:  Diagnosis Date  . Anxiety   . Depression   . Dizzy 06/2018  . Gluten intolerance   . Headache   . Hypothyroidism   . Panic disorder 12/18/2019  . Thyroid  disease    hypothyroid    Past Surgical History:  Procedure Laterality Date  . CHOLECYSTECTOMY      Current Medications: Current Outpatient Medications on File Prior to Visit  Medication Sig  . Cholecalciferol (VITAMIN D3 PO) Take 2,000 Units by mouth daily.  Marland Kitchen levothyroxine (SYNTHROID) 125 MCG tablet Take 1 tablet (125 mcg total) by mouth daily before breakfast.  . loratadine (CLARITIN) 10 MG tablet Take 10 mg by mouth daily.  Marland Kitchen LORazepam (ATIVAN) 0.5 MG tablet Take 0.5 mg by mouth every 12 (twelve) hours as needed for anxiety.   Marland Kitchen MAGNESIUM CITRATE PO Take 250 mg by mouth daily.  . Omega-3 Fatty Acids (FISH OIL PO) Take 1,000 mg by mouth daily.   No current facility-administered medications on file prior to visit.     Allergies:   Advil [ibuprofen]; Antihistamines, diphenhydramine-type; Gluten meal; and Penicillins   Social History   Tobacco Use  . Smoking status: Never Smoker  . Smokeless tobacco: Never Used  Vaping Use  . Vaping Use: Never used  Substance Use Topics  . Alcohol use: Yes    Comment: 1-2 per month  . Drug use: Never    Family History: The patient's family history includes Bipolar disorder in her sister; Diabetes in her mother; Heart disease (age of onset: 60) in her father; Heart failure (age of onset: 23) in her father; Hypertension in her mother; Multiple sclerosis in her mother.  ROS:   Please see the history of present illness.  Additional pertinent ROS as noted in HPI.  EKGs/Labs/Other Studies Reviewed:    The following studies were reviewed today: Echo 06/19/18 - Left ventricle: The cavity size was normal. Wall thickness was   normal. Systolic function was normal. The estimated ejection   fraction was in the range of 55% to 60%. Wall motion was normal;   there were no regional wall motion abnormalities. Left   ventricular diastolic function parameters were normal.  EKG:  EKG is personally reviewed today.  The ekg ordered today demonstrates  normal sinus rhythm at 73 bpm  Recent Labs: 11/26/2019: BUN 12; Creatinine, Ser 0.70; Hemoglobin 14.4; Platelets 226; Potassium 4.1; Sodium 139 06/30/2020: TSH 0.17  Recent Lipid Panel    Component Value Date/Time   CHOL 121 06/19/2018 0134   TRIG 77 06/19/2018 0134   HDL 32 (L) 06/19/2018 0134   CHOLHDL 3.8 06/19/2018 0134   VLDL 15 06/19/2018 0134   LDLCALC 74 06/19/2018 0134    Physical Exam:    VS:  BP 111/73   Pulse 73   Ht 5' 4.5" (1.638 m)   Wt 173 lb (78.5 kg)   LMP 06/18/2020   SpO2 100%   BMI 29.24 kg/m     Wt Readings from Last 3 Encounters:  07/13/20 173 lb (78.5 kg)  06/30/20 175 lb (79.4 kg)  06/23/20 171 lb (77.6 kg)    GEN: Well nourished, well developed in no acute distress HEENT: Normal, moist mucous membranes NECK: No JVD CARDIAC: regular rhythm, normal S1 and S2, no rubs or gallops. No murmur. VASCULAR: Radial and DP pulses 2+ bilaterally. No carotid bruits RESPIRATORY:  Clear to auscultation without rales, wheezing or rhonchi  ABDOMEN: Soft, non-tender, non-distended MUSCULOSKELETAL:  Ambulates independently SKIN: Warm and dry, no edema NEUROLOGIC:  Alert and oriented x 3. No focal neuro deficits noted. PSYCHIATRIC:  Normal affect   ASSESSMENT:    1. Fatigue, unspecified type   2. Precordial pain   3. Palpitations   4. Cardiac risk counseling   5. Counseling on health promotion and disease prevention    PLAN:    Fatigue, unclear leg weakness: -being evaluated by Dr. Caryl Never -reports prior history of transient neuro symptoms that self resolved  Chronic chest pain, as a part of entire body pain, and intermittent palpitations -this has been unchanged -normal echo -she has an apple watch and a kardiamobile. Both show normal rhythm when she is having symptoms. -likely part of complex symptom pattern -no high risk features, but we did discuss red flag warning signs that need immediate medical attention  CV risk counseling and  prevention: -recommend heart healthy/Mediterranean diet, with whole grains, fruits, vegetable, fish, lean meats, nuts, and olive oil. Limit salt. -recommend moderate walking, 3-5 times/week for 30-50 minutes each session. Aim for at least 150 minutes.week. Goal should be pace of 3 miles/hours, or walking 1.5 miles in 30 minutes -recommend avoidance of tobacco products. Avoid excess alcohol. -ASCVD risk score: The ASCVD Risk score Denman George DC Jr., et al., 2013) failed to calculate for the following reasons:   The 2013 ASCVD risk score is only valid for ages 32 to 23   Follow up in 1 year or sooner as needed  Jodelle Red, MD, PhD [place]  [facility]   Medication Adjustments/Labs and Tests Ordered: Current medicines are reviewed at length with the patient today.  Concerns regarding medicines are outlined above.  Orders Placed This Encounter  Procedures  . EKG 12-Lead   No orders of the defined types were placed in this encounter.  Patient Instructions  Medication Instructions:  Your Physician recommend you continue on your current medication as directed.    *If you need a refill on your cardiac medications before your next appointment, please call your pharmacy*   Lab Work: None ordered   Testing/Procedures: None ordered    Follow-Up: At [facility], you and your health needs are our priority.  As part of our continuing mission to provide you with exceptional heart care, we have created designated Provider Care Teams.  These Care Teams include your primary Cardiologist (physician) and Advanced Practice Providers (APPs -  Physician Assistants and Nurse Practitioners) who all work together to provide you with the care you need, when you need it.  We recommend signing up for the patient portal called "MyChart".  Sign up information is provided on this After Visit Summary.  MyChart is used to connect with patients for Virtual Visits (Telemedicine).  Patients are  able to view lab/test results, encounter notes, upcoming appointments, etc.  Non-urgent messages can be sent to your provider as well.   To learn more about what you can do with MyChart, go to [email or web address].    Your next appointment:   1 year(s)  The format for your next appointment:   In Person  Provider:   Jodelle Red, MD        Signed, Jodelle Red, MD PhD 07/13/2020 12:50 PM    [facility]

## 2020-07-14 ENCOUNTER — Ambulatory Visit: Payer: BC Managed Care – PPO | Admitting: Family Medicine

## 2020-07-14 ENCOUNTER — Encounter: Payer: Self-pay | Admitting: Family Medicine

## 2020-07-14 VITALS — BP 108/50 | HR 93 | Temp 98.1°F | Ht 64.5 in | Wt 172.3 lb

## 2020-07-14 DIAGNOSIS — R29898 Other symptoms and signs involving the musculoskeletal system: Secondary | ICD-10-CM | POA: Diagnosis not present

## 2020-07-14 NOTE — Patient Instructions (Signed)
Work on the quadriceps strengthening exercises as discussed  Follow up in about 3 months for repeat thyroid tests  Follow up sooner for any numbness or progressive weakness.

## 2020-07-14 NOTE — Progress Notes (Signed)
Established Patient Office Visit  Subjective:  Patient ID: Ryan Palermo, female    DOB: 1981-04-04  Age: 39 y.o. MRN: [id number]  CC:  Chief Complaint  Patient presents with  . Follow-up    leg weakness is progressively worse, weakness in bilateral knees now    HPI Vauda Salvucci presents for follow-up for recent concern for lower extremity weakness bilaterally.  Refer to note from 06/30/2020 for details  Jayme Mednick presents for question of weakness lower extremities which came on fairly acutely about 2 days ago.  She states she had very similar episode in college and had extensive work-up done including lumbar tap and MRI of the brain which was unremarkable.  She had recent atypical frontal headache which is essentially resolved.  No fever.  No chills.  Denies any low back pain.  No sensory deficits.  No urine or stool incontinence.  She noted couple days ago she had sensation that her legs "felt heavy ".  She states that she felt like she was "walking and glue ".  Denies any upper extremity weakness.  Patient states she had MRI of the brain March 24 another clinic and was reportedly normal.  She does have hypothyroidism and is on replacement.  Her TSH was over 5 when we checked her labs last summer.  She has been consistently taking levothyroxine 125 mcg daily.  Recent vitamin D level normal B12 level normal.  We did not see clinical evidence for Guillain-Barr and really nonfocal exam.  We recommended observation follow-up.  She has really not had any progression of symptoms.  She states that she feels her knees are weak at times when going downstairs still exercising regularly with walking with her husband including some heels and seems to do okay with that.  She denies any recent falls.  No back pain.  No urine or stool incontinence.  No lower extremity numbness.  No associated myalgias.  No skin rashes.  We did not realize that she had been following endocrinology for her TSH which has been  considerably up and down.  She does have follow-up with them in February.  She is taking her thyroid replacement appropriately early in the morning on empty stomach without other medications.  Past Medical History:  Diagnosis Date  . Anxiety   . Depression   . Dizzy 06/2018  . Gluten intolerance   . Headache   . Hypothyroidism   . Panic disorder 12/18/2019  . Thyroid disease    hypothyroid    Past Surgical History:  Procedure Laterality Date  . CHOLECYSTECTOMY      Family History  Problem Relation Age of Onset  . Hypertension Mother   . Multiple sclerosis Mother   . Diabetes Mother   . Heart failure Father 70  . Heart disease Father 82  . Bipolar disorder Sister     Social History   Socioeconomic History  . Marital status: Married    Spouse name: Not on file  . Number of children: 2  . Years of education: Graduate school  . Highest education level: Not on file  Occupational History  . 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 Smoking status: Never Smoker  . Smokeless tobacco: Never Used  Vaping Use  . Vaping Use: Never used  Substance and Sexual Activity  . Alcohol use: Yes    Comment: 1-2 per month  . Drug use: Never  . Sexual activity: Yes  Other Topics Concern  . Not on file  Social History Narrative   Lives in [place] w/ husband and 2 kids.   Caffeine use: 1x/week or less- coffee   Right handed    Social Determinants of Health   Financial Resource Strain:   . Difficulty of Paying Living Expenses: Not on file  Food Insecurity:   . Worried About Programme researcher, broadcasting/film/video in the Last Year: Not on file  . Ran Out of Food in the Last Year: Not on file  Transportation Needs:   . Lack of Transportation (Medical): Not on file  . Lack of Transportation (Non-Medical): Not on file  Physical Activity:   . Days of Exercise per Week: Not on file  . Minutes of Exercise per Session: Not on file  Stress:   . Feeling of Stress : Not on file  Social Connections:   .  Frequency of Communication with Friends and Family: Not on file  . Frequency of Social Gatherings with Friends and Family: Not on file  . Attends Religious Services: Not on file  . Active Member of Clubs or Organizations: Not on file  . Attends Banker Meetings: Not on file  . Marital Status: Not on file  Intimate Partner Violence:   . Fear of Current or Ex-Partner: Not on file  . Emotionally Abused: Not on file  . Physically Abused: Not on file  . Sexually Abused: Not on file    Outpatient Medications Prior to Visit  Medication Sig Dispense Refill  . albuterol (VENTOLIN HFA) 108 (90 Base) MCG/ACT inhaler INHALE 1 PUFF BY MOUTH EVERY 4 HOURS AS NEEDED    . Cholecalciferol (VITAMIN D3 PO) Take 2,000 Units by mouth daily.    Marland Kitchen levothyroxine (SYNTHROID) 125 MCG tablet Take 1 tablet (125 mcg total) by mouth daily before breakfast. 90 tablet 3  . loratadine (CLARITIN) 10 MG tablet Take 10 mg by mouth daily.    Marland Kitchen LORazepam (ATIVAN) 0.5 MG tablet Take 0.5 mg by mouth every 12 (twelve) hours as needed for anxiety.   1  . MAGNESIUM CITRATE PO Take 250 mg by mouth daily.    . Omega-3 Fatty Acids (FISH OIL PO) Take 1,000 mg by mouth daily.     No facility-administered medications prior to visit.    Allergies  Allergen Reactions  . Penicillins Hives, Itching and Swelling    And swelling And swelling  . Antihistamines, Diphenhydramine-Type     Increased anxiety  . Gluten Meal   . Ibuprofen Other (See Comments)    Kidney dysfunction Other reaction(s): Other (See Comments) Kidney dysfunction    ROS Review of Systems  Constitutional: Negative for appetite change, chills, fever and unexpected weight change.  Respiratory: Negative for shortness of breath.   Cardiovascular: Negative for chest pain.  Gastrointestinal: Negative for abdominal pain.  Musculoskeletal: Negative for back pain.  Neurological: Negative for numbness.      Objective:    Physical Exam Vitals  reviewed.  Constitutional:      Appearance: Normal appearance.  Cardiovascular:     Rate and Rhythm: Normal rate and regular rhythm.  Pulmonary:     Effort: Pulmonary effort is normal.     Breath sounds: Normal breath sounds.  Musculoskeletal:     Right lower leg: No edema.     Left lower leg: No edema.  Neurological:     Mental Status: She is alert.     Comments: Sensory function is intact throughout lower extremities.  She has 2+ reflexes knee and ankle bilaterally.  She has good strength with plantarflexion dorsiflexion bilaterally.  She seems slightly weak with knee extension and hip flexion bilaterally but is able to get up and down out of a chair without difficulty and is able to do full squat with no difficulties     BP (!) 108/50   Pulse 93   Temp 98.1 F (36.7 C) (Oral)   Ht 5' 4.5" (1.638 m)   Wt 172 lb 4.8 oz (78.2 kg)   LMP 06/18/2020   SpO2 98%   BMI 29.12 kg/m  Wt Readings from Last 3 Encounters:  07/14/20 172 lb 4.8 oz (78.2 kg)  07/13/20 173 lb (78.5 kg)  06/30/20 175 lb (79.4 kg)     Health Maintenance Due  Topic Date Due  . Hepatitis C Screening  Never done  . TETANUS/TDAP  Never done  . PAP SMEAR-Modifier  Never done  . INFLUENZA VACCINE  Never done    There are no preventive care reminders to display for this patient.  Lab Results  Component Value Date   TSH 0.17 (L) 06/30/2020   Lab Results  Component Value Date   WBC 5.7 11/26/2019   HGB 14.4 11/26/2019   HCT 42.7 11/26/2019   MCV 91.6 11/26/2019   PLT 226 11/26/2019   Lab Results  Component Value Date   NA 139 11/26/2019   K 4.1 11/26/2019   CO2 25 11/26/2019   GLUCOSE 99 11/26/2019   BUN 12 11/26/2019   CREATININE 0.70 11/26/2019   BILITOT 0.7 06/20/2018   ALKPHOS 29 (L) 06/20/2018   AST 12 (L) 06/20/2018   ALT 11 06/20/2018   PROT 6.3 (L) 06/20/2018   ALBUMIN 3.7 06/20/2018   CALCIUM 8.9 11/26/2019   ANIONGAP 11 11/26/2019   Lab Results  Component Value Date   CHOL  121 06/19/2018   Lab Results  Component Value Date   HDL 32 (L) 06/19/2018   Lab Results  Component Value Date   LDLCALC 74 06/19/2018   Lab Results  Component Value Date   TRIG 77 06/19/2018   Lab Results  Component Value Date   CHOLHDL 3.8 06/19/2018   Lab Results  Component Value Date   HGBA1C 4.9 06/19/2018      Assessment & Plan:   Patient complaining of lower extremity weakness bilaterally.  She relates similar episode back in college and had full work-up then with no clear abnormalities.  Her exam remains essentially unchanged.  No progression of symptoms.  -We suggested some quadricep strengthening exercises and gave her several examples. -If she develops any progressive weakness or any numbness we recommended neurologic follow-up but at this point her exam is relatively non-focal -She has follow-up in February with endocrinology regarding her hypothyroidism  No orders of the defined types were placed in this encounter.   Follow-up: No follow-ups on file.    Evelena Peat, MD

## 2020-07-15 DIAGNOSIS — F410 Panic disorder [episodic paroxysmal anxiety] without agoraphobia: Secondary | ICD-10-CM | POA: Diagnosis not present

## 2020-07-15 DIAGNOSIS — F411 Generalized anxiety disorder: Secondary | ICD-10-CM | POA: Diagnosis not present

## 2020-07-19 DIAGNOSIS — F411 Generalized anxiety disorder: Secondary | ICD-10-CM | POA: Diagnosis not present

## 2020-07-19 DIAGNOSIS — F410 Panic disorder [episodic paroxysmal anxiety] without agoraphobia: Secondary | ICD-10-CM | POA: Diagnosis not present

## 2020-07-23 DIAGNOSIS — F410 Panic disorder [episodic paroxysmal anxiety] without agoraphobia: Secondary | ICD-10-CM | POA: Diagnosis not present

## 2020-07-23 DIAGNOSIS — F411 Generalized anxiety disorder: Secondary | ICD-10-CM | POA: Diagnosis not present

## 2020-07-29 DIAGNOSIS — F411 Generalized anxiety disorder: Secondary | ICD-10-CM | POA: Diagnosis not present

## 2020-07-29 DIAGNOSIS — F410 Panic disorder [episodic paroxysmal anxiety] without agoraphobia: Secondary | ICD-10-CM | POA: Diagnosis not present

## 2020-08-05 DIAGNOSIS — F411 Generalized anxiety disorder: Secondary | ICD-10-CM | POA: Diagnosis not present

## 2020-08-17 DIAGNOSIS — F411 Generalized anxiety disorder: Secondary | ICD-10-CM | POA: Diagnosis not present

## 2020-08-17 DIAGNOSIS — F410 Panic disorder [episodic paroxysmal anxiety] without agoraphobia: Secondary | ICD-10-CM | POA: Diagnosis not present

## 2020-08-24 DIAGNOSIS — F411 Generalized anxiety disorder: Secondary | ICD-10-CM | POA: Diagnosis not present

## 2020-08-24 DIAGNOSIS — F410 Panic disorder [episodic paroxysmal anxiety] without agoraphobia: Secondary | ICD-10-CM | POA: Diagnosis not present

## 2020-08-30 DIAGNOSIS — F410 Panic disorder [episodic paroxysmal anxiety] without agoraphobia: Secondary | ICD-10-CM | POA: Diagnosis not present

## 2020-08-30 DIAGNOSIS — F411 Generalized anxiety disorder: Secondary | ICD-10-CM | POA: Diagnosis not present

## 2020-09-06 DIAGNOSIS — F411 Generalized anxiety disorder: Secondary | ICD-10-CM | POA: Diagnosis not present

## 2020-09-06 DIAGNOSIS — F410 Panic disorder [episodic paroxysmal anxiety] without agoraphobia: Secondary | ICD-10-CM | POA: Diagnosis not present

## 2020-09-13 DIAGNOSIS — F411 Generalized anxiety disorder: Secondary | ICD-10-CM | POA: Diagnosis not present

## 2020-09-13 DIAGNOSIS — F410 Panic disorder [episodic paroxysmal anxiety] without agoraphobia: Secondary | ICD-10-CM | POA: Diagnosis not present

## 2020-09-20 DIAGNOSIS — F411 Generalized anxiety disorder: Secondary | ICD-10-CM | POA: Diagnosis not present

## 2020-09-20 DIAGNOSIS — F410 Panic disorder [episodic paroxysmal anxiety] without agoraphobia: Secondary | ICD-10-CM | POA: Diagnosis not present

## 2020-09-21 DIAGNOSIS — D2230 Melanocytic nevi of unspecified part of face: Secondary | ICD-10-CM | POA: Diagnosis not present

## 2020-09-21 DIAGNOSIS — D239 Other benign neoplasm of skin, unspecified: Secondary | ICD-10-CM | POA: Diagnosis not present

## 2020-09-21 DIAGNOSIS — L814 Other melanin hyperpigmentation: Secondary | ICD-10-CM | POA: Diagnosis not present

## 2020-09-21 DIAGNOSIS — D225 Melanocytic nevi of trunk: Secondary | ICD-10-CM | POA: Diagnosis not present

## 2020-09-28 DIAGNOSIS — F410 Panic disorder [episodic paroxysmal anxiety] without agoraphobia: Secondary | ICD-10-CM | POA: Diagnosis not present

## 2020-09-28 DIAGNOSIS — F411 Generalized anxiety disorder: Secondary | ICD-10-CM | POA: Diagnosis not present

## 2020-10-19 ENCOUNTER — Other Ambulatory Visit: Payer: Self-pay | Admitting: Internal Medicine

## 2020-10-19 ENCOUNTER — Telehealth: Payer: Self-pay | Admitting: Internal Medicine

## 2020-10-19 DIAGNOSIS — E038 Other specified hypothyroidism: Secondary | ICD-10-CM

## 2020-10-19 DIAGNOSIS — E063 Autoimmune thyroiditis: Secondary | ICD-10-CM

## 2020-10-19 DIAGNOSIS — E042 Nontoxic multinodular goiter: Secondary | ICD-10-CM

## 2020-10-19 NOTE — Telephone Encounter (Signed)
Kara Patton, Are you the right person to send this type of request so you can fax them the required records?  I did place the referral already. Ty! C

## 2020-10-19 NOTE — Telephone Encounter (Signed)
Patient called and requested a Outbound Referral to Endocrinologist in Her Network.  The patient is having to change to an Endocrinologist in her insurance network.  She is requesting a referral to [facility] on [address].  [facility] requests 1 year of records to be sent with their referrals.  Fax # is [phone]

## 2020-10-19 NOTE — Telephone Encounter (Signed)
Please see below.

## 2020-10-21 NOTE — Telephone Encounter (Signed)
Hi Dr. Elvera Lennox, I was not aware that I was doing outgoing referrals for you guys anymore but I am happy to help- I have faxed out the notes requested to [facility] on [facility]

## 2020-10-21 NOTE — Telephone Encounter (Signed)
Misty Stanley, I also did not know, and I originally sent the message to [facility], but today I asked Melissa and she directed me to you. Thank you for sending them! C

## 2020-10-21 NOTE — Telephone Encounter (Signed)
Misty Stanley, Please see below.  Can you please let me know when you are able to fax the notes. Ty, C

## 2020-10-21 NOTE — Telephone Encounter (Signed)
The person doing the outgoing referrals is [facility] as far as I know!

## 2020-12-10 ENCOUNTER — Ambulatory Visit: Payer: BC Managed Care – PPO | Admitting: Internal Medicine

## 2021-04-05 ENCOUNTER — Other Ambulatory Visit: Payer: Self-pay | Admitting: Family Medicine

## 2022-05-07 ENCOUNTER — Other Ambulatory Visit: Payer: Self-pay | Admitting: Family Medicine

## 2022-06-19 ENCOUNTER — Emergency Department (HOSPITAL_COMMUNITY)
Admission: EM | Admit: 2022-06-19 | Discharge: 2022-06-19 | Disposition: A | Discharge status: Home / Self Care | Payer: BLUE CROSS/BLUE SHIELD | Attending: Emergency Medicine | Admitting: Emergency Medicine

## 2022-06-19 ENCOUNTER — Other Ambulatory Visit: Payer: Self-pay

## 2022-06-19 ENCOUNTER — Emergency Department (HOSPITAL_COMMUNITY): Payer: BLUE CROSS/BLUE SHIELD

## 2022-06-19 ENCOUNTER — Encounter (HOSPITAL_COMMUNITY): Payer: Self-pay

## 2022-06-19 DIAGNOSIS — E039 Hypothyroidism, unspecified: Secondary | ICD-10-CM | POA: Diagnosis not present

## 2022-06-19 DIAGNOSIS — I639 Cerebral infarction, unspecified: Secondary | ICD-10-CM | POA: Diagnosis not present

## 2022-06-19 DIAGNOSIS — F430 Acute stress reaction: Secondary | ICD-10-CM

## 2022-06-19 DIAGNOSIS — R42 Dizziness and giddiness: Secondary | ICD-10-CM | POA: Insufficient documentation

## 2022-06-19 DIAGNOSIS — E876 Hypokalemia: Secondary | ICD-10-CM | POA: Diagnosis not present

## 2022-06-19 DIAGNOSIS — G43409 Hemiplegic migraine, not intractable, without status migrainosus: Secondary | ICD-10-CM | POA: Diagnosis not present

## 2022-06-19 DIAGNOSIS — I951 Orthostatic hypotension: Secondary | ICD-10-CM

## 2022-06-19 LAB — CBC
HCT: 38.3 % (ref 36.0–46.0)
Hemoglobin: 13 g/dL (ref 12.0–15.0)
MCH: 30.7 pg (ref 26.0–34.0)
MCHC: 33.9 g/dL (ref 30.0–36.0)
MCV: 90.5 fL (ref 80.0–100.0)
Platelets: 246 10*3/uL (ref 150–400)
RBC: 4.23 MIL/uL (ref 3.87–5.11)
RDW: 11.6 % (ref 11.5–15.5)
WBC: 7.6 10*3/uL (ref 4.0–10.5)
nRBC: 0 % (ref 0.0–0.2)

## 2022-06-19 LAB — COMPREHENSIVE METABOLIC PANEL
ALT: 16 U/L (ref 0–44)
AST: 17 U/L (ref 15–41)
Albumin: 3.3 g/dL — ABNORMAL LOW (ref 3.5–5.0)
Alkaline Phosphatase: 44 U/L (ref 38–126)
Anion gap: 8 (ref 5–15)
BUN: 9 mg/dL (ref 6–20)
CO2: 23 mmol/L (ref 22–32)
Calcium: 8.3 mg/dL — ABNORMAL LOW (ref 8.9–10.3)
Chloride: 108 mmol/L (ref 98–111)
Creatinine, Ser: 0.72 mg/dL (ref 0.44–1.00)
GFR, Estimated: >60 mL/min (ref >60)
Glucose, Bld: 100 mg/dL — ABNORMAL HIGH (ref 70–99)
Potassium: 3.2 mmol/L — ABNORMAL LOW (ref 3.5–5.1)
Sodium: 139 mmol/L (ref 135–145)
Total Bilirubin: 0.3 mg/dL (ref 0.3–1.2)
Total Protein: 6.1 g/dL — ABNORMAL LOW (ref 6.5–8.1)

## 2022-06-19 LAB — PROTIME-INR
INR: 1 (ref 0.8–1.2)
Prothrombin Time: 13.1 seconds (ref 11.4–15.2)

## 2022-06-19 LAB — DIFFERENTIAL
Abs Immature Granulocytes: 0.03 10*3/uL (ref 0.00–0.07)
Basophils Absolute: 0 10*3/uL (ref 0.0–0.1)
Basophils Relative: 1 %
Eosinophils Absolute: 0 10*3/uL (ref 0.0–0.5)
Eosinophils Relative: 1 %
Immature Granulocytes: 0 %
Lymphocytes Relative: 39 %
Lymphs Abs: 3 10*3/uL (ref 0.7–4.0)
Monocytes Absolute: 0.5 10*3/uL (ref 0.1–1.0)
Monocytes Relative: 7 %
Neutro Abs: 4 10*3/uL (ref 1.7–7.7)
Neutrophils Relative %: 52 %

## 2022-06-19 LAB — I-STAT CHEM 8, ED
BUN: 9 mg/dL (ref 6–20)
Calcium, Ion: 1.09 mmol/L — ABNORMAL LOW (ref 1.15–1.40)
Chloride: 103 mmol/L (ref 98–111)
Creatinine, Ser: 0.6 mg/dL (ref 0.44–1.00)
Glucose, Bld: 100 mg/dL — ABNORMAL HIGH (ref 70–99)
HCT: 40 % (ref 36.0–46.0)
Hemoglobin: 13.6 g/dL (ref 12.0–15.0)
Potassium: 3.3 mmol/L — ABNORMAL LOW (ref 3.5–5.1)
Sodium: 141 mmol/L (ref 135–145)
TCO2: 24 mmol/L (ref 22–32)

## 2022-06-19 LAB — I-STAT BETA HCG BLOOD, ED (MC, WL, AP ONLY): I-stat hCG, quantitative: <5 m[IU]/mL (ref <5)

## 2022-06-19 LAB — ETHANOL: Alcohol, Ethyl (B): <10 mg/dL (ref <10)

## 2022-06-19 LAB — APTT: aPTT: 30 seconds (ref 24–36)

## 2022-06-19 MED ORDER — SODIUM CHLORIDE 0.9 % IV BOLUS
1000.0000 mL | Freq: Once | INTRAVENOUS | Status: AC
  Administered 2022-06-19: 1000 mL via INTRAVENOUS

## 2022-06-19 NOTE — ED Triage Notes (Signed)
Pt was LSW at 2325. Pt was watching tv and got up  to use restroom. She became dizzy and weak on left side and had syncopal episode. Husband called EMS. PT A&Ox4 but slow to respond to questions. Pt NIH is 0

## 2022-06-19 NOTE — Code Documentation (Signed)
Responded to Code Stroke called at 2353 for L sided weakness and slurred speech, [id number]. Pt arrived at 0000, NIH-0, CT head negative for acute changes. TNK not given>NIH-0. Plan to tx for complicated migraine. VS/neuro checks q2h x 12, then q4h.

## 2022-06-19 NOTE — Discharge Instructions (Signed)
You were seen in the ER today for your weakness episode.  You were evaluated by the neurologist and the emergency medicine team without clear the source of her symptoms, however there was no identification of any emergent problem.  You have been scheduled for an outpatient EEG.  Please follow-up with [facility] neurology office listed below for this procedure and please keep your previously scheduled appointment for a tilt test.  Please increase your hydration at home and return to the ER with any new severe symptoms.

## 2022-06-19 NOTE — Consult Note (Addendum)
NEUROLOGY CONSULTATION NOTE   Date of service: June 19, 2022 Patient Name: Kara Patton MRN:  [id number] DOB:  25-Feb-1981 Reason for consult: "stroke code" Requesting Provider: Sabas Sous, MD _ _ _   _ __   _ __ _ _  __ __   _ __   __ _  History of Present Illness  Kara Patton is a 41 y.o. female with PMH significant for anxiety, depression, gluten intolerance, headaches, hypothyroidism, SVT who presents with child like voice, L sided weakness.  Reports that she has been given a working diagnosis of subclinical POTS and is scheduled for a tilt table test next week. Been dealing with these episodes of dizziness, pain in her sternum and anxiety/panic attack. Has some good days and some bad days. Reports that today in the evening, was not feeling good. Sat on the couch and then got up to walk slow. Felt anxious like her POTs episode was coming. She was experiencing some tremulousness/trembling of both arms. Was talkative during those but at one point was not very responsive. Around 2300, felt left sided weakness and headache in the back of her head and so husband called 911.  Here, noted to have child like voice on initial evaluation along with posterior throbbing and pressure like headache and left sided weakness that is improving with a NIHSS of 0 now.  Exam with inconsistent left sided weakness. Felt to be effort dependent and weakness fluctuates. Also noted that her speech improves as she talks and is distracted.  LKW: 2310 mRS: 0 tNKASE: not offered, los suspicion for stroke. Thrombectomy: not offered, low suspicion for stroke NIHSS components Score: Comment  1a Level of Conscious 0[x]  1[]  2[]  3[]      1b LOC Questions 0[x]  1[]  2[]       1c LOC Commands 0[x]  1[]  2[]       2 Best Gaze 0[x]  1[]  2[]       3 Visual 0[x]  1[]  2[]  3[]      4 Facial Palsy 0[x]  1[]  2[]  3[]      5a Motor Arm - left 0[x]  1[]  2[]  3[]  4[]  UN[]    5b Motor Arm - Right 0[x]  1[]  2[]  3[]  4[]  UN[]    6a Motor Leg - Left  0[x]  1[]  2[]  3[]  4[]  UN[]    6b Motor Leg - Right 0[x]  1[]  2[]  3[]  4[]  UN[]    7 Limb Ataxia 0[x]  1[]  2[]  3[]  UN[]     8 Sensory 0[x]  1[]  2[]  UN[]      9 Best Language 0[x]  1[]  2[]  3[]      10 Dysarthria 0[x]  1[]  2[]  UN[]      11 Extinct. and Inattention 0[x]  1[]  2[]       TOTAL: 0         ROS   Constitutional Denies weight loss, fever and chills.   HEENT Denies changes in vision and hearing.   Respiratory Denies SOB and cough.   CV Denies palpitations, but had sternal pain  GI Denies abdominal pain, nausea, vomiting and diarrhea.   GU Denies dysuria and urinary frequency.   MSK Denies myalgia and joint pain.   Skin Denies rash and pruritus.   Neurological +  headache ?syncope.   Psychiatric Denies recent changes in mood. Denies anxiety and depression.    Past History   Past Medical History:  Diagnosis Date   Anxiety    Depression    Dizzy 06/2018   Gluten intolerance    Headache    Hypothyroidism    Panic disorder 12/18/2019   Thyroid  disease    hypothyroid   Past Surgical History:  Procedure Laterality Date   CHOLECYSTECTOMY     Family History  Problem Relation Age of Onset   Hypertension Mother    Multiple sclerosis Mother    Diabetes Mother    Heart failure Father 8   Heart disease Father 36   Bipolar disorder Sister    Social History   Socioeconomic History   Marital status: Married    Spouse name: Not on file   Number of children: 2   Years of education: Graduate school   Highest education level: Not on file  Occupational History   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   Caffeine use: 1x/week or less- coffee   Right handed    Social  Determinants of Health   Financial Resource Strain: Not on file  Food Insecurity: Not on file  Transportation Needs: Not on file  Physical Activity: Not on file  Stress: Not on file  Social Connections: Not on file   Allergies  Allergen Reactions   Penicillins Hives, Itching and Swelling    And swelling And swelling   Antihistamines, Diphenhydramine-Type     Increased anxiety   Gluten Meal    Ibuprofen Other (See Comments)    Kidney dysfunction Other reaction(s): Other (See Comments) Kidney dysfunction    Medications  (Not in a hospital admission)    Vitals   Vitals:   06/19/22 0030 06/19/22 0045 06/19/22 0109  BP: 98/67 110/78   Pulse: 69 75 73  Resp: 20 10 10   Temp:  98.3 F (36.8 C)   SpO2: 99% 98% 97%     There is no height or weight on file to calculate BMI.  Physical Exam   General: Laying comfortably in bed; in no acute distress.  HENT: Normal oropharynx and mucosa. Normal external appearance of ears and nose.  Neck: Supple, no pain or tenderness  CV: No JVD. No peripheral edema.  Pulmonary: Symmetric Chest rise. Normal respiratory effort.  Abdomen: Soft to touch, non-tender.  Ext: No cyanosis, edema, or deformity  Skin: No rash. Normal palpation of skin.   Musculoskeletal: Normal digits and nails by inspection. No clubbing.   Neurologic Examination  Mental status/Cognition: Alert, oriented to self, place, month and year, good attention.  Speech/language: Fluent, comprehension intact, object naming intact, repetition intact.  Cranial nerves:   CN II Pupils equal and reactive to light, no VF deficits    CN III,IV,VI EOM intact, no gaze preference or deviation, no nystagmus    CN V normal sensation in V1, V2, and V3 segments bilaterally    CN VII no asymmetry, no nasolabial fold flattening    CN VIII normal hearing to speech    CN IX & X normal palatal elevation, no uvular deviation    CN XI 5/5 head turn and 5/5 shoulder shrug bilaterally    CN  XII midline tongue protrusion    Motor:  Muscle bulk: normal, tone normal, pronator drift none tremor none Mvmt Root Nerve  Muscle Right Left Comments  SA C5/6 Ax Deltoid 5 5  EF C5/6 Mc Biceps 5 5   EE C6/7/8 Rad Triceps 5 5   WF C6/7 Med FCR     WE C7/8 PIN ECU     F Ab C8/T1 U ADM/FDI 5 5   HF L1/2/3 Fem Illopsoas 5 5   KE L2/3/4 Fem Quad 5 5   DF L4/5 D Peron Tib Ant 5 5   PF S1/2 Tibial Grc/Sol 5 5    Reflexes:  Right Left Comments  Pectoralis      Biceps (C5/6) 2 2   Brachioradialis (C5/6) 2 2    Triceps (C6/7) 2 2    Patellar (L3/4) 2 2    Achilles (S1)      Hoffman      Plantar     Jaw jerk    Sensation:  Light touch Intact throughout   Pin prick    Temperature    Vibration   Proprioception    Coordination/Complex Motor:  - Finger to Nose intact BL - Heel to shin intact BL - Rapid alternating movement are normal - Gait: Deferred.  Labs   CBC:  Recent Labs  Lab 06/19/22 0100  HGB 13.6  HCT 40.0    Basic Metabolic Panel:  Lab Results  Component Value Date   NA 141 06/19/2022   K 3.3 (L) 06/19/2022   CO2 25 11/26/2019   GLUCOSE 100 (H) 06/19/2022   BUN 9 06/19/2022   CREATININE 0.60 06/19/2022   CALCIUM 8.9 11/26/2019   GFRNONAA >60 11/26/2019   GFRAA >60 11/26/2019   Lipid Panel:  Lab Results  Component Value Date   LDLCALC 74 06/19/2018   HgbA1c:  Lab Results  Component Value Date   HGBA1C 4.9 06/19/2018   Urine Drug Screen:     Component Value Date/Time   LABOPIA NONE DETECTED 06/18/2018 1906   COCAINSCRNUR NONE DETECTED 06/18/2018 1906   LABBENZ NONE DETECTED 06/18/2018 1906   AMPHETMU NONE DETECTED 06/18/2018 1906   THCU NONE DETECTED 06/18/2018 1906   LABBARB NONE DETECTED 06/18/2018 1906    Alcohol Level No results found for: "ETH"  CT Head without contrast(Personally reviewed): CTH was negative for a large hypodensity concerning for a large territory infarct or hyperdensity concerning for an ICH  Impression    Kara Patton is a 41 y.o. female with PMH significant for with PMH significant for anxiety, depression, gluten intolerance, headaches, hypothyroidism, SVT who today in the evening, was not feeling good. Sat on the couch and then got up to walk slow. Felt anxious like her POTs episode was coming. She was experiencing some tremulousness/trembling of both arms. Was talkative during those but at one point was not very responsive. Around 2300, felt left sided weakness and headache in the back of her head and so husband called 911.  Here, noted to have effort dependent left sided weakness with fluctuating strength, child like voice with no true dysarthric speech. Symptoms improved in the ED. Felt to either be a stress response vs hemiplegic migraine. Unlikely to be stroke.  Discussed with patient. She is scheduled for tilt table test next week for further evaluation for POT which I think is the next best step. Also discussed other potential etiologies, felt stroke unlikely just based on my clinical evaluation. I offered her the option for MRI Brain but she is significantly claustrophobic and given the fact that it is low yield, I did not push on it further. Unlikely for the event of tremulousness to be a seizure. Never had a  EEG. The description of the BL tremulousness given is not suggestive of seizures. Unlikely for someone to have intact mentation if they have seizure causing bilateral twitching. Will get EEG outpatient.  Offered migraine cocktail but she reports paradoxical reaction to benadryl and agreeable to fluids at this time given headache is not too bad. Also discussed talking to her psychotherapist about CBT.  Recommendations  - orthostatic vitals x 1 - routine EEG outpatient. Will try to get in the next few days. - follow up with neurology outpatient. - no further inpatient neurologic workup at this time. - fluid  bolus. ______________________________________________________________________   Thank you for the opportunity to take part in the care of this patient. If you have any further questions, please contact the neurology consultation attending.  Signed,  Erick Blinks [facility] Pager Number [phone] _ _ _   _ __   _ __ _ _  __ __   _ __   __ _

## 2022-06-19 NOTE — ED Provider Notes (Signed)
[place] EMERGENCY DEPARTMENT Provider Note   CSN: [id number] Arrival date & time: 06/19/22  0000     History  No chief complaint on file.   Kara Patton is a 41 y.o. female who presented via EMS as a code stroke with LKN of 2300.  History of anxiety, depression, hypothyroidism, headaches, and panic disorder who presented with concern for For reported left-sided weakness and difficulty speaking today.  Cleared at the bridge by EDP and transported directly to CT where she was evaluated by Dr. Lorrin Goodell, neurologist.  I reevaluated patient after CT head code stroke.  Per patient's family she was watching TV, got up suddenly to use the restroom and became very lightheaded, felt weak reportedly more on the left abdomen the right, laid down on the floor at which time she was tremulous according to her husband for approximately 30 minutes.  She was awake the whole time but not conversing with him normally which prompted EMS.  Level 5 caveat due to acuity presentation upon arrival.  In addition to above listed history patient has history of dizziness in the past.  NIH of 0 at time of arrival per neurology. Upon chart review was in [facility] for weakness for the past 2 years as well as intermittent tachycardia, previously diagnosed with paroxysmal SVT.  Plan is for ablation but this was not performed due to financial concerns lightheadedness with changing position.  Had negative PE study on 8/27 patient was apparently very tearful and upset at Patient follows with cardiology and neurology.  Patient previously prescribed flecainide and metoprolol for SVT which she has refused to take any longer, apparently pursuing POTS rule out with cardiology per chart review had Zio patch that had a brief episode of SVT but no recurring symptoms. She was seen in the emergency department 2021 concern for facial numbness, slurred speech, had normal MRI at that  time. HPI     Home Medications Prior to Admission medications   Medication Sig Start Date End Date Taking? Authorizing Provider  albuterol (VENTOLIN HFA) 108 (90 Base) MCG/ACT inhaler INHALE 1 PUFF BY MOUTH EVERY 4 HOURS AS NEEDED 09/08/19   [provider]  Cholecalciferol (VITAMIN D3 PO) Take 2,000 Units by mouth daily.    [provider]  levothyroxine (SYNTHROID) 125 MCG tablet TAKE 1 TABLET (125 MCG TOTAL) BY MOUTH DAILY BEFORE BREAKFAST. 04/05/21   Burchette, Alinda Sierras, MD  loratadine (CLARITIN) 10 MG tablet Take 10 mg by mouth daily.    [provider]  LORazepam (ATIVAN) 0.5 MG tablet Take 0.5 mg by mouth every 12 (twelve) hours as needed for anxiety.  05/27/18   [provider]  MAGNESIUM CITRATE PO Take 250 mg by mouth daily.    [provider]  Omega-3 Fatty Acids (FISH OIL PO) Take 1,000 mg by mouth daily.    [provider]      Allergies    Penicillins; Antihistamines, diphenhydramine-type; Gluten meal; and Ibuprofen    Review of Systems   Review of Systems  Unable to perform ROS: Acuity of condition  Neurological:  Positive for speech difficulty and weakness.    Physical Exam Updated Vital Signs There were no vitals taken for this visit. Physical Exam Vitals and nursing note reviewed.  HENT:     Head: Normocephalic and atraumatic.     Mouth/Throat:     Mouth: Mucous membranes are moist.     Pharynx: No oropharyngeal exudate or posterior  oropharyngeal erythema.  Eyes:     General: No visual field deficit.       Right eye: No discharge.        Left eye: No discharge.     Conjunctiva/sclera: Conjunctivae normal.  Cardiovascular:     Rate and Rhythm: Normal rate and regular rhythm.     Pulses: Normal pulses.  Pulmonary:     Effort: Pulmonary effort is normal. No respiratory distress.     Breath sounds: Normal breath sounds. No wheezing or rales.  Abdominal:     General: Bowel sounds are normal. There is no  distension.     Tenderness: There is no abdominal tenderness.  Musculoskeletal:        General: No deformity.     Cervical back: Neck supple.  Skin:    General: Skin is warm and dry.     Capillary Refill: Capillary refill takes less than 2 seconds.  Neurological:     Mental Status: She is alert and oriented to person, place, and time.     GCS: GCS eye subscore is 4. GCS verbal subscore is 5. GCS motor subscore is 6.     Cranial Nerves: No cranial nerve deficit, dysarthria or facial asymmetry.     Sensory: Sensation is intact.     Motor: Weakness present. No abnormal muscle tone or pronator drift.     Coordination: Coordination is intact.     Comments: Patient is slowly, stating that it feels like her tongue was asleep, improving at this time. Patient with left-sided upper extremity weakness on exam, appears effort dependent; waxing and waning in severity throughout exam. With 2/5 strength in the left relative to 4/5 on the right.  Gait deferred by patient due to sensation of lightheadedness.   Psychiatric:        Speech: Speech is delayed.     ED Results / Procedures / Treatments   Labs (all labs ordered are listed, but only abnormal results are displayed) Labs Reviewed  RESP PANEL BY RT-PCR (FLU A&B, COVID) ARPGX2  ETHANOL  PROTIME-INR  APTT  CBC  DIFFERENTIAL  COMPREHENSIVE METABOLIC PANEL  RAPID URINE DRUG SCREEN, HOSP PERFORMED  URINALYSIS, ROUTINE W REFLEX MICROSCOPIC  I-STAT CHEM 8, ED  I-STAT BETA HCG BLOOD, ED ([facility], [facility], [facility] ONLY)    EKG None  Radiology CT HEAD CODE STROKE WO CONTRAST  Result Date: 06/19/2022 CLINICAL DATA:  Code stroke. Initial evaluation for neuro deficit, stroke suspected. EXAM: CT HEAD WITHOUT CONTRAST TECHNIQUE: Contiguous axial images were obtained from the base of the skull through the vertex without intravenous contrast. RADIATION DOSE REDUCTION: This exam was performed according to the departmental dose-optimization program which includes  automated exposure control, adjustment of the mA and/or kV according to patient size and/or use of iterative reconstruction technique. COMPARISON:  Prior MRI from 06/19/2018. FINDINGS: Brain: Cerebral volume within normal limits for patient age. No evidence for acute intracranial hemorrhage. No findings to suggest acute large vessel territory infarct. No mass lesion, midline shift, or mass effect. Ventricles are normal in size without evidence for hydrocephalus. No extra-axial fluid collection identified. Vascular: No hyperdense vessel identified. Skull: Scalp soft tissues demonstrate no acute abnormality. Calvarium intact. Sinuses/Orbits: Globes and orbital soft tissues within normal limits. Visualized paranasal sinuses are clear. No mastoid effusion. ASPECTS [facility] Stroke Program Early CT Score) - Ganglionic level infarction (caudate, lentiform nuclei, internal capsule, insula, M1-M3 cortex): 7 - Supraganglionic infarction (M4-M6 cortex): 3 Total score (0-10 with 10 being normal): 10 IMPRESSION:  1. Normal head CT.  No acute intracranial abnormality. 2. ASPECTS is 10. These results were communicated to Dr. Lorrin Goodell at 12:24 am on 06/19/2022 by text page via the [facility] messaging system. Electronically Signed   By: Jeannine Boga M.D.   On: 06/19/2022 00:24    Procedures Procedures    Medications Ordered in ED Medications - No data to display  ED Course/ Medical Decision Making/ A&P                           Medical Decision Making 41 year old female presents with concern for code stroke on arrival.  Patient is normal on intake, cardiopulmonary exam is normal, abdominal exam is benign.  Patient is vascularly intact in all extremities.  Normal sensation in all extremities, left upper extremity weakness as above though appears effort dependent.  Cranial nerves are intact.  Patient speaking clearly though speaking very slowly, childlike affect, anxious.  Case discussed with neurologist Dr.  Lorrin Goodell, who had accompanied patient to the CT scanner at time of arrival as code stroke.  Per neurologist patient is not experiencing CVA, weakness appears to be functional weakness and CT head code stroke was negative.  No indication for MRI at this time.  Amount and/or Complexity of Data Reviewed Labs: ordered.    Details: CBC without leukocytosis or anemia, CMP with mild hypokalemia of 3.2, otherwise unremarkable.  INR is normal, patient is not pregnant. Radiology: ordered.    Details:  CT head code stroke as above, negative for acute intracranial abnormality. ECG/medicine tests:     Details: EKG with normal sinus rhythm Discussion of management or test interpretation with external provider(s): Consult by neurologist, see associated note.   Patient reevaluated multiple times.  Symptoms were improving however suddenly patient's symptoms worsened with progressive lightheadedness now stating she cannot sit up in the bed due to severe lightheadedness.  Patient refusing to complete orthostatic vital signs, reporting lightheadedness and reportedly collapsing back on the bed refusing to try to stand any longer.  Worsening patient's symptoms correlated with arrival of her husband to the bedside.  Patient administered fluid bolus, attempted to ambulate the patient multiple times with progressive worsening of her lightheadedness each time.  No orthostasis per RN on orthostatic vital signs from lying to sitting.  Per neurologist, patient's presentation appears primarily functional, no concern for acute neurologic phenomenon at this time.  No evidence of acute cardiac etiology given reassuring EKG and normal sinus rhythm on monitor throughout her stay in the ED.  Plan is for outpatient EEG, to be scheduled by neurology.  We will also recommend return to her outpatient cardiologist.  Increase hydration at home.  While the etiology of this patient symptoms remains unclear does not appear to be any emergent  etiology at this time.  Patient continuing to pursue POTS diagnosis in the outpatient setting with tilt table test next week on 06/26/2022.  Patient disappointed with plan for discharge home; stating she does not feel she should be sent home as she is too lightheaded, will not be able to function normally.  Reiterated that there is no evidence of emergent etiology of her symptoms at this time, normal hemodynamics.  Patient was discharged from the emergency department in good condition.  Do recommend close outpatient follow-up and strict return precautions are given.  Tyrae and her husband  voiced understanding of her medical evaluation and treatment plan. Each of their questions answered to their expressed satisfaction.  Return  precautions were given.  Patient is well-appearing, stable, and was discharged in good condition.  This chart was dictated using voice recognition software, Dragon. Despite the best efforts of this provider to proofread and correct errors, errors may still occur which can change documentation meaning.   Final Clinical Impression(s) / ED Diagnoses Final diagnoses:  None    Rx / DC Orders ED Discharge Orders     None         Paris Lore, PA-C 06/19/22 7096    Sabas Sous, MD 06/19/22 [phone]

## 2023-10-04 ENCOUNTER — Encounter (HOSPITAL_BASED_OUTPATIENT_CLINIC_OR_DEPARTMENT_OTHER): Payer: Self-pay

## 2023-10-04 ENCOUNTER — Emergency Department (HOSPITAL_BASED_OUTPATIENT_CLINIC_OR_DEPARTMENT_OTHER): Payer: BLUE CROSS/BLUE SHIELD | Admitting: Radiology

## 2023-10-04 ENCOUNTER — Emergency Department (HOSPITAL_BASED_OUTPATIENT_CLINIC_OR_DEPARTMENT_OTHER)
Admission: EM | Admit: 2023-10-04 | Discharge: 2023-10-04 | Disposition: A | Discharge status: Home / Self Care | Payer: BLUE CROSS/BLUE SHIELD | Attending: Emergency Medicine | Admitting: Emergency Medicine

## 2023-10-04 ENCOUNTER — Other Ambulatory Visit: Payer: Self-pay

## 2023-10-04 DIAGNOSIS — E039 Hypothyroidism, unspecified: Secondary | ICD-10-CM | POA: Insufficient documentation

## 2023-10-04 DIAGNOSIS — R55 Syncope and collapse: Secondary | ICD-10-CM | POA: Insufficient documentation

## 2023-10-04 DIAGNOSIS — R1013 Epigastric pain: Secondary | ICD-10-CM | POA: Diagnosis not present

## 2023-10-04 DIAGNOSIS — Z79899 Other long term (current) drug therapy: Secondary | ICD-10-CM | POA: Insufficient documentation

## 2023-10-04 DIAGNOSIS — R079 Chest pain, unspecified: Secondary | ICD-10-CM | POA: Insufficient documentation

## 2023-10-04 LAB — CBC
HCT: 43.3 % (ref 36.0–46.0)
Hemoglobin: 14.4 g/dL (ref 12.0–15.0)
MCH: 30.5 pg (ref 26.0–34.0)
MCHC: 33.3 g/dL (ref 30.0–36.0)
MCV: 91.7 fL (ref 80.0–100.0)
Platelets: 290 10*3/uL (ref 150–400)
RBC: 4.72 MIL/uL (ref 3.87–5.11)
RDW: 12 % (ref 11.5–15.5)
WBC: 6.2 10*3/uL (ref 4.0–10.5)
nRBC: 0 % (ref 0.0–0.2)

## 2023-10-04 LAB — BASIC METABOLIC PANEL
Anion gap: 7 (ref 5–15)
BUN: 11 mg/dL (ref 6–20)
CO2: 28 mmol/L (ref 22–32)
Calcium: 9.4 mg/dL (ref 8.9–10.3)
Chloride: 103 mmol/L (ref 98–111)
Creatinine, Ser: 0.71 mg/dL (ref 0.44–1.00)
GFR, Estimated: >60 mL/min (ref >60)
Glucose, Bld: 100 mg/dL — ABNORMAL HIGH (ref 70–99)
Potassium: 4.1 mmol/L (ref 3.5–5.1)
Sodium: 138 mmol/L (ref 135–145)

## 2023-10-04 LAB — HEPATIC FUNCTION PANEL
ALT: 23 U/L (ref 0–44)
AST: 18 U/L (ref 15–41)
Albumin: 4.4 g/dL (ref 3.5–5.0)
Alkaline Phosphatase: 50 U/L (ref 38–126)
Bilirubin, Direct: 0.1 mg/dL (ref 0.0–0.2)
Indirect Bilirubin: 0.4 mg/dL (ref 0.3–0.9)
Total Bilirubin: 0.5 mg/dL (ref <1.2)
Total Protein: 7.6 g/dL (ref 6.5–8.1)

## 2023-10-04 LAB — TROPONIN I (HIGH SENSITIVITY)
Troponin I (High Sensitivity): <2 ng/L (ref <18)
Troponin I (High Sensitivity): <2 ng/L (ref <18)

## 2023-10-04 LAB — LIPASE, BLOOD: Lipase: <10 U/L — ABNORMAL LOW (ref 11–51)

## 2023-10-04 LAB — PREGNANCY, URINE: Preg Test, Ur: NEGATIVE

## 2023-10-04 MED ORDER — LIDOCAINE 5 % EX PTCH
1.0000 | MEDICATED_PATCH | CUTANEOUS | Status: DC
Start: 2023-10-04 — End: 2023-10-05
  Administered 2023-10-04: 1 via TRANSDERMAL
  Filled 2023-10-04: qty 1

## 2023-10-04 MED ORDER — ALUM & MAG HYDROXIDE-SIMETH 200-200-20 MG/5ML PO SUSP
30.0000 mL | Freq: Once | ORAL | Status: AC
  Administered 2023-10-04: 30 mL via ORAL
  Filled 2023-10-04: qty 30

## 2023-10-04 NOTE — Discharge Instructions (Addendum)
As discussed, your labs and imaging are reassuring.  Take Tylenol every 6-8 hours as needed for symptoms as well as other OTC analgesics like lidocaine patches.  Follow up with your PCP, Dr. Jon Billings, in the next several days for reevaluation.  Get help right away if: You have nausea or vomiting. You feel sweaty or light-headed. You have a cough with mucus from your lungs (sputum) or you cough up blood. You develop shortness of breath.

## 2023-10-04 NOTE — ED Triage Notes (Signed)
Pt c/o CP onset yesterday, has begun to radiate around & through shoulder into back. Pt w hx POTS, advises of syncopal episode yesterday as well, unsure if shoulder/ back pain is r/t syncope or other. Pt denies NV, additional symptoms.

## 2023-10-04 NOTE — ED Provider Notes (Signed)
[place] EMERGENCY DEPARTMENT AT [facility] Provider Note   CSN: [id number] Arrival date & time: 10/04/23  1351     History  Chief Complaint  Patient presents with   Chest Pain   Loss of Consciousness    yesterday    Kara Patton is a 42 y.o. female with a history of POTS, hypothyroidism, and anxiety presents the ED today for chest pain.  Patient reports waking up with a burning sensation of the left side of her chest that radiates down to her wrist and to her left upper back.  Denies any exacerbating or alleviating factors.  She has tried Tylenol, Pepcid, and Pepto-Bismol without any relief.  Patient states that she was having epigastric and right upper quadrant pain last week and thought could be due to acid reflux.  This morning she thought that chest pain was related to reflux as well and took Pepto-Bismol and Pepcid without relief.    Additionally, she reports a syncopal episode yesterday. She states that she was at her desk at work, then felt like she was going to pass out so she sat on the ground. She woke up laying flat on her back. Denies head injury, vision changes, weakness, neck or back pain.    Home Medications Prior to Admission medications   Medication Sig Start Date End Date Taking? Authorizing Provider  albuterol (VENTOLIN HFA) 108 (90 Base) MCG/ACT inhaler INHALE 1 PUFF BY MOUTH EVERY 4 HOURS AS NEEDED 09/08/19   [provider]  Cholecalciferol (VITAMIN D3 PO) Take 2,000 Units by mouth daily.    [provider]  levothyroxine (SYNTHROID) 125 MCG tablet TAKE 1 TABLET (125 MCG TOTAL) BY MOUTH DAILY BEFORE BREAKFAST. 04/05/21   Burchette, Elberta Fortis, MD  loratadine (CLARITIN) 10 MG tablet Take 10 mg by mouth daily.    [provider]  LORazepam (ATIVAN) 0.5 MG tablet Take 0.5 mg by mouth every 12 (twelve) hours as needed for anxiety.  05/27/18   [provider]  MAGNESIUM CITRATE PO Take 250 mg by mouth daily.    [provider]  Omega-3 Fatty Acids (FISH OIL PO) Take 1,000 mg by mouth daily.    [provider]      Allergies    Penicillins; Antihistamines, diphenhydramine-type; Gluten meal; and Ibuprofen    Review of Systems   Review of Systems  Cardiovascular:  Positive for chest pain.  All other systems reviewed and are negative.   Physical Exam Updated Vital Signs BP 113/80   Pulse 76   Temp 98 F (36.7 C) (Oral)   Resp 15   LMP 10/01/2023 (Exact Date)   SpO2 100%  Physical Exam Vitals and nursing note reviewed.  Constitutional:      Appearance: Normal appearance.  HENT:     Head: Normocephalic and atraumatic.     Mouth/Throat:     Mouth: Mucous membranes are moist.  Eyes:     Conjunctiva/sclera: Conjunctivae normal.     Pupils: Pupils are equal, round, and reactive to light.  Cardiovascular:     Rate and Rhythm: Normal rate and regular rhythm.     Pulses: Normal pulses.     Heart sounds: Normal heart sounds.  Pulmonary:     Effort: Pulmonary effort is normal.     Breath sounds: Normal breath sounds.  Abdominal:     Palpations: Abdomen is soft.     Tenderness: There is no abdominal tenderness.  Musculoskeletal:        General:  No tenderness, deformity or signs of injury. Normal range of motion.     Cervical back: Normal range of motion.  Skin:    General: Skin is warm and dry.     Findings: No rash.  Neurological:     General: No focal deficit present.     Mental Status: She is alert.     Sensory: No sensory deficit.     Motor: No weakness.  Psychiatric:        Mood and Affect: Mood normal.        Behavior: Behavior normal.    ED Results / Procedures / Treatments   Labs (all labs ordered are listed, but only abnormal results are displayed) Labs Reviewed  BASIC METABOLIC PANEL - Abnormal; Notable for the following components:      Result Value   Glucose, Bld 100 (*)    All other components within normal limits  LIPASE, BLOOD - Abnormal; Notable for  the following components:   Lipase <10 (*)    All other components within normal limits  CBC  PREGNANCY, URINE  HEPATIC FUNCTION PANEL  TROPONIN I (HIGH SENSITIVITY)  TROPONIN I (HIGH SENSITIVITY)    EKG EKG Interpretation Date/Time:  Thursday October 04 2023 14:00:51 EST Ventricular Rate:  92 PR Interval:  136 QRS Duration:  74 QT Interval:  368 QTC Calculation: 455 R Axis:   80  Text Interpretation: Normal sinus rhythm Nonspecific ST and T wave abnormality Abnormal ECG When compared with ECG of 19-Jun-2022 01:08, PREVIOUS ECG IS PRESENT No significant change since last tracing Confirmed by Jacalyn Lefevre [phone]) on 10/04/2023 2:03:08 PM  Radiology DG Chest 2 View Result Date: 10/04/2023 CLINICAL DATA:  Chest pain EXAM: CHEST - 2 VIEW COMPARISON:  None Available. FINDINGS: The heart size and mediastinal contours are within normal limits. No consolidation, pneumothorax or effusion. No edema. The visualized skeletal structures are unremarkable. Surgical clips in the right upper quadrant. IMPRESSION: No acute cardiopulmonary disease. Electronically Signed   By: Karen Kays M.D.   On: 10/04/2023 18:05    Procedures Procedures: not indicated.   Medications Ordered in ED Medications  lidocaine (LIDODERM) 5 % 1 patch (1 patch Transdermal Patch Applied 10/04/23 1643)  alum & mag hydroxide-simeth (MAALOX/MYLANTA) 200-200-20 MG/5ML suspension 30 mL (30 mLs Oral Given 10/04/23 1811)    ED Course/ Medical Decision Making/ A&P                                 Medical Decision Making Amount and/or Complexity of Data Reviewed Labs: ordered. Radiology: ordered.  Risk Prescription drug management.   This patient presents to the ED for concern of chest pain, this involves an extensive number of treatment options, and is a complaint that carries with it a high risk of complications and morbidity.   Differential diagnosis includes: ACS, acid reflux, pleuritic chest pain,  costochondritis, muscle strain/spasm,    Comorbidities  See HPI above   Additional History  Additional history obtained from prior records.   Cardiac Monitoring / EKG  The patient was maintained on a cardiac monitor.  I personally viewed and interpreted the cardiac monitored which showed: NSR with a heart rate of 92 bpm.   Lab Tests  I ordered and personally interpreted labs.  The pertinent results include:   Initial and delta troponin <2 BMP, hepatic panel, lipase, and CBC are unremarkable Negative pregnancy test   Imaging Studies  I ordered  imaging studies including CXR  I independently visualized and interpreted imaging which showed: no acute cardiopulmonary processes. I agree with the radiologist interpretation   Problem List / ED Course / Critical Interventions / Medication Management  Burning sensation at left chest I ordered medications including: Lidocaine patch for pain  Reevaluation of the patient after these medicines showed that the patient improved at the area where the patch is. I have reviewed the patients home medicines and have made adjustments as needed   Social Determinants of Health  Physical activity   Test / Admission - Considered  Discussed findings with patient. She is stable and safe for discharge home.  Advise close follow-up with her primary care provider. Return precautions given.       Final Clinical Impression(s) / ED Diagnoses Final diagnoses:  Chest pain, unspecified type    Rx / DC Orders ED Discharge Orders     None         Maxwell Marion, PA-C 10/04/23 1929    Coral Spikes, DO 10/04/23 1939
